# Patient Record
Sex: Male | Born: 1975 | Race: Black or African American | Hispanic: No | Marital: Single | Smoking: Current every day smoker
Health system: Southern US, Community
[De-identification: ages and names within clinical notes are randomized; demographics above are authoritative.]

---

## 2002-12-26 ENCOUNTER — Emergency Department (HOSPITAL_COMMUNITY): Admission: EM | Admit: 2002-12-26 | Discharge: 2002-12-26 | Payer: Self-pay | Admitting: Emergency Medicine

## 2006-08-18 ENCOUNTER — Emergency Department (HOSPITAL_COMMUNITY): Admission: EM | Admit: 2006-08-18 | Discharge: 2006-08-18 | Payer: Self-pay | Admitting: Emergency Medicine

## 2009-05-22 ENCOUNTER — Emergency Department (HOSPITAL_COMMUNITY): Admission: EM | Admit: 2009-05-22 | Discharge: 2009-05-22 | Payer: Self-pay | Admitting: Emergency Medicine

## 2011-03-06 ENCOUNTER — Emergency Department (HOSPITAL_COMMUNITY)
Admission: EM | Admit: 2011-03-06 | Discharge: 2011-03-06 | Disposition: A | Discharge status: Home / Self Care | Payer: Self-pay | Attending: Emergency Medicine | Admitting: Emergency Medicine

## 2011-03-06 DIAGNOSIS — M25569 Pain in unspecified knee: Secondary | ICD-10-CM | POA: Insufficient documentation

## 2011-03-06 DIAGNOSIS — M25469 Effusion, unspecified knee: Secondary | ICD-10-CM | POA: Insufficient documentation

## 2011-03-18 ENCOUNTER — Ambulatory Visit (HOSPITAL_BASED_OUTPATIENT_CLINIC_OR_DEPARTMENT_OTHER)
Admission: RE | Admit: 2011-03-18 | Discharge: 2011-03-18 | Disposition: A | Discharge status: Home / Self Care | Payer: Self-pay | Source: Ambulatory Visit | Attending: Orthopedic Surgery | Admitting: Orthopedic Surgery

## 2011-03-18 DIAGNOSIS — F17200 Nicotine dependence, unspecified, uncomplicated: Secondary | ICD-10-CM | POA: Insufficient documentation

## 2011-03-18 DIAGNOSIS — Z01812 Encounter for preprocedural laboratory examination: Secondary | ICD-10-CM | POA: Insufficient documentation

## 2011-03-18 DIAGNOSIS — M23329 Other meniscus derangements, posterior horn of medial meniscus, unspecified knee: Secondary | ICD-10-CM | POA: Insufficient documentation

## 2011-03-18 DIAGNOSIS — M2340 Loose body in knee, unspecified knee: Secondary | ICD-10-CM | POA: Insufficient documentation

## 2011-03-18 DIAGNOSIS — M2350 Chronic instability of knee, unspecified knee: Secondary | ICD-10-CM | POA: Insufficient documentation

## 2011-03-20 NOTE — Op Note (Signed)
NAME:  Scott Mckee, Scott Mckee NO.:  [id number]  MEDICAL RECORD NO.:  [id number]           PATIENT TYPE:  E  LOCATION:  [facility]                         FACILITY:  [facility]  PHYSICIAN:  Eulas Post, MD    DATE OF BIRTH:  Mar 06, 1976  DATE OF PROCEDURE:  03/18/2011 DATE OF DISCHARGE:  03/06/2011                              OPERATIVE REPORT   PREOPERATIVE DIAGNOSIS:  Left knee anterior cruciate ligament tear.  POSTOPERATIVE DIAGNOSIS:  Left knee anterior cruciate ligament tear, medial meniscus tear, loose body.  OPERATIVE PROCEDURE:  Left knee arthroscopy with ACL reconstruction using hamstring autograft and partial medial meniscectomy with removal of 2 loose bodies, one measured almost 2 cm in diameter.  PREOPERATIVE INDICATIONS:  Mr. Scott Mckee is a 35 year old gentleman who has had chronic knee instability with ACL disruption.  He elected to undergo reconstruction.  The risks, benefits, and alternatives were discussed before the procedure including, but not limited to risks of infection, bleeding, nerve injury, stiffness, incomplete relief of symptoms, recurrent instability, recurrent rupture, progression of arthritis, cardiopulmonary complications, among others, and he is willing to proceed.  ATTENDING SURGEON:  Eulas Post, MD  FIRST ASSISTANT:  Janace Litten, Orthopedic PA-C  OPERATIVE PROCEDURE:  The patient was brought to the operating room, placed in supine position.  IV antibiotics were given.  General anesthesia administered.  The left lower extremity was prepped and draped in the usual sterile fashion.  Regional block had also been given.  The leg was elevated, exsanguinated, and tourniquet was inflated.  Examination under anesthesia was done prior to prepping and draping, and this demonstrated ACL deficiency.  I carefully examined his lateral collateral and his posterolateral lateral ligament, and these did not appear to be loose.  He had some  varus deformity, I suspect secondary to progression of early arthritis.  I began with harvesting his hamstring tendons.  After the leg was elevated, exsanguinated, and the tourniquet was inflated, incision was made over the hamstring insertion and the sartorius fascia was reflected and the gracilis and semitendinosus were harvested without difficulty. Excellent grafts were achieved.  Care was taken to protect the saphenous nerve.  The grafts were then prepared.  I then turned my attention to the knee and diagnostic arthroscopy was carried out.  The undersurface of the patella as well as the patellofemoral joint were normal.  The articular surfaces of the medial compartment had diffuse grade 2 wear.  There was also tearing of the medial meniscus in the mid substance of the body as well as posteriorly around the posterior horn.  The anterior horn was fairly blunted.  The meniscus was debrided using arthroscopic shaver and the biter back to a stable rim.  The lateral compartment had relatively intact cartilage, although there was a fairly substantial loose body that was underneath the posterior horn of the lateral meniscus.  This was debrided down of the shaver and removed piecemeal with a rongeur.  I then removed a very large loose body in the anterior notch, which measured almost 2 cm in diameter.  I then debrided the old ACL which was completely nonfunctional.  The PCL was intact.  I performed a mild notchplasty.  I then placed my guide for the femoral tunnel using a 9 retro cutter. The graft size measured to a size 9.  Once I had drilled my tunnel, I confirmed that the back wall was indeed intact and I had excellent position of the tunnel that was low approximating the anatomic footprint of the ACL.  I then passed my FiberWire as the holding stitch and passed my retro cutter through the tibial tunnel and then opened the tibia with a 9 and then began with a 5 and dilated back up  to a 9.  Once the tunnels had been prepared, I then passed the graft.  I did have the graft on tension during the preparation of the tunnels.  The graft passed easily and the button flipped and I was able to visualize it directly by looking up through the tunnel and then I used C-arm to confirm appropriate position of the button.  I then delivered the graft all the way up and I had 25 mm of graft in the tunnel and I cycled the knee.  Appropriate isometry was confirmed, and any slack within the system was removed.  I then measured the length of my tibial tunnel, and measured 40 mm and so I selected a size 10 x 35- mm Arthrex bio-composite interference screw.  This was placed without difficulty and provided excellent fixation.  The tension of the graft was excellent and the knee was then stable.  A final C-arm picture was taken to confirm position of the button which was appropriate on the bone, and I also took final arthroscopic pictures.  The knee reached full extension easily without any graft impingement.  The knee was then irrigated copiously and all of the arthroscopic instruments removed and the sartorius fascia closed with 0 Vicryl followed by 3-0 for subcutaneous tissue and 4-0 Monocryl for the skin and portal sites. Steri-Strips and sterile gauze were applied.  The tourniquet was released and tourniquet time was 2 hours.  The patient was then awakened and extubated and returned to the PACU in stable satisfactory condition. There were no complications.  He tolerated the procedure well.  Janace Litten, Orthopedic PA-C was present and scrubbed throughout the procedure and critical for completion both with exposure as well as graft preparation and closure as well as assistance with passage and drilling tunnels.     Eulas Post, MD     JPL/MEDQ  D:  03/18/2011  T:  03/19/2011  Job:  [id number]  Electronically Signed by Teryl Lucy MD on 03/20/2011 11:28:35 AM

## 2011-03-31 NOTE — Op Note (Signed)
NAME:  Scott Mckee, Scott Mckee NO.:  [id number]  MEDICAL RECORD NO.:  [id number]           PATIENT TYPE:  LOCATION:                                 FACILITY:  PHYSICIAN:  Eulas Post, MD    DATE OF BIRTH:  [birth year]  DATE OF PROCEDURE:  03/18/2011 DATE OF DISCHARGE:                              OPERATIVE REPORT   ATTENDING SURGEON:  Eulas Post, MD  FIRST ASSISTANT:  Janace Litten, OPA  PREOPERATIVE DIAGNOSIS:  Left knee anterior cruciate ligament tear, loose bodies, question lateral collateral ligament deficiency.  POSTOPERATIVE DIAGNOSIS:  Left anterior cruciate ligament tear, chronic medial degenerative changes, tear of the medial meniscus in the mid substance of the body and posterior horn, and large loose body in the central portion of the notch as well as large loose body on the undersurface of the lateral meniscus.  No evidence for posterolateral corner or lateral collateral ligament tear.  OPERATIVE PROCEDURE:  Left knee arthroscopy with ACL reconstruction using hamstring autograft and removal of a 2-cm loose body from the notch as well as one from the posterolateral aspect of the tibia with partial medial meniscectomy.  OPERATIVE IMPLANTS:  Hamstring autograft with Arthrex RetroButton and the size 9 graft with a 10 x 35-mm composite tibial interference screw.  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  He had multiple recurrent episodes of instability and elected for ACL reconstruction and the above-named procedures.  The risks, benefits and alternatives were discussed before the procedure including, but not limited to the risks of infection, bleeding, nerve injury, recurrent instability, recurrent tear, progression of arthritis, incomplete relief of pain, stiffness, cardiopulmonary complications, among others and they were willing to  proceed.  OPERATIVE FINDINGS:  The medial compartment had at least grade 2 changes.  The lateral side was intact.  There was a fairly large loose body both underneath the lateral meniscus as well as in the center of the notch.  Both of these were removed.  The ACL was torn.  The PCL was intact.  Lateral collateral and posterolateral corner were intact and tested with Veress testing as well as dial test.  OPERATIVE PROCEDURE:  The patient was brought to the operating room and placed in supine position.  General anesthesia was administered.  Exam under anesthesia demonstrated instability with Lachman testing.  Lateral collateral was intact.  After general anesthesia was induced and exam under anesthesia completed, the left lower extremity was prepped and draped in the usual sterile fashion.  Diagnostic arthroscopy was carried out after I harvested his hamstring tendons.  The hamstring tendons harvest went quite smoothly, without any complication, and we had excellent graft quality.  The graft sized to size 9.  I used an arthroscopic shaver as well as an arthroscopic biter to debride the medial meniscus back to a stable rim. He had substantial degenerative changes on his medial compartment.  The ACL was completely torn.  The stump and tissue was debrided and removed and a small notchplasty was performed.  I had  to extend the size of my portal in order to get the huge loose body out of the anterior portion of his knee.  There was also an another smaller loose body that was in the posterior aspect of the tibia on the lateral side underneath the meniscus.  This was removed with an arthroscopic pituitary biter, as well as the shaver.  Once all of this had been completed, I used the size 9 retro cutter for the femur, and then a size 9 for the tibia.  I also opened the tibial cortex with a size 9 reamer and also dilated up.  I passed the graft and confirmed position of the button using C-arm.   Excellent fixation was achieved on the femoral side.  I cycled the knee and had appropriate isometry, then I tensioned the graft and placed a 10 x 35-mm biocomposite tibial interference screw.  Excellent fixation was achieved.  There was no graft impingement.  I then cut the sutures and cut the stitches off of the graft and removed the arthroscopic instruments.  Wounds were closed with Vicryl followed by Monocryl.  The patient was awakened and returned to the PACU in stable and satisfactory condition.  Sterile gauze was applied after closure.  He tolerated this well.  There were no complications.  Weightbearing as tolerated, and we will work on range of motion and quadriceps strengthening postoperatively.  Janace Litten, orthopedic PA-C was present and scrubbed throughout the case and critical for completion with assistance with exposure, retraction, preparation of the graft, as well as positioning of equipment during tunnel placement and passage as well as closure.     Eulas Post, MD     JPL/MEDQ  D:  03/28/2011  T:  03/29/2011  Job:  [id number]  Electronically Signed by Teryl Lucy MD on 03/31/2011 10:18:07 AM

## 2014-02-20 ENCOUNTER — Emergency Department (HOSPITAL_COMMUNITY)
Admission: EM | Admit: 2014-02-20 | Discharge: 2014-02-20 | Disposition: A | Discharge status: Home / Self Care | Payer: Self-pay | Attending: Emergency Medicine | Admitting: Emergency Medicine

## 2014-02-20 ENCOUNTER — Encounter (HOSPITAL_COMMUNITY): Payer: Self-pay | Admitting: Emergency Medicine

## 2014-02-20 DIAGNOSIS — M2550 Pain in unspecified joint: Secondary | ICD-10-CM

## 2014-02-20 DIAGNOSIS — M62838 Other muscle spasm: Secondary | ICD-10-CM | POA: Insufficient documentation

## 2014-02-20 DIAGNOSIS — F17200 Nicotine dependence, unspecified, uncomplicated: Secondary | ICD-10-CM | POA: Insufficient documentation

## 2014-02-20 DIAGNOSIS — M2559 Pain in other specified joint: Secondary | ICD-10-CM | POA: Insufficient documentation

## 2014-02-20 MED ORDER — CYCLOBENZAPRINE HCL 10 MG PO TABS: 10.0000 mg | ORAL_TABLET | Freq: Two times a day (BID) | ORAL | Status: DC | PRN

## 2014-02-20 NOTE — ED Notes (Signed)
Pt c/o right neck and shoulder pain x 1 month. No known injury. States has intermittent tingling in fingers of right hand.

## 2014-02-20 NOTE — Discharge Instructions (Signed)
No driving or operating heavy machinery while taking flexeril. This medication may make you drowsy.  Spasticity Spasticity is a condition in which certain muscles contract continuously. This causes stiffness or tightness of the muscles. It may interfere with movement, speech, and manner of walking. CAUSES  This condition is usually caused by damage to the portion of the brain or spinal cord that controls voluntary movement. It may occur in association with:  Spinal cord injury.  Multiple sclerosis.  Cerebral palsy.  Brain damage due to lack of oxygen.  Brain trauma.  Severe head injury.  Metabolic diseases such as:  Adrenoleukodystrophy.  ALS Truddie Hidden(Lou Gehrig's disease).  Phenylketonuria. SYMPTOMS   Increased muscle tone (hypertonicity).  A series of rapid muscle contractions (clonus).  Exaggerated deep tendon reflexes.  Muscle spasms.  Involuntary crossing of the legs (scissoring).  Fixed joints. The degree of spasticity varies. It ranges from mild muscle stiffness to severe, painful, and uncontrollable muscle spasms. It can interfere with rehabilitation in patients with certain disorders. It often interferes with daily activities. TREATMENT  Treatment may include:  Medications.  Physical therapy regimens. They may include muscle stretching and range of motion exercises. These help prevent shrinkage or shortening of muscles. They also help reduce the severity of symptoms.  Surgery. This may be recommended for tendon release or to sever the nerve-muscle pathway. PROGNOSIS  The outcome for those with spasticity depends on:  Severity of the spasticity.  Associated disorder(s). Document Released: 11/21/2002 Document Revised: 02/23/2012 Document Reviewed: 12/01/2005 [facility] Patient Information 2014 [place], [place].

## 2014-02-20 NOTE — ED Provider Notes (Signed)
CSN: [id number]     Arrival date & time 02/20/14  1442 History  This chart was scribed for non-physician practitioner, Johnnette Gourdobyn Albert, PA-C working with Gavin PoundMichael Y. Oletta LamasGhim, MD by Greggory StallionKayla Andersen, ED scribe. This patient was seen in room TR05C/TR05C and the patient's care was started at 3:14 PM.   Chief Complaint  Patient presents with  . Shoulder Pain   The history is provided by the patient. No language interpreter was used.   HPI Comments: Scott Mckee is a 38 y.o. male who presents to the Emergency Department complaining of gradual onset, constant right sided neck pain and right shoulder pain that started one month ago. Denies known injury or heavy lifting. Pt was recently wrestling with his son and states that made the pain worse. He has had intermittent tingling in his right fingers. Pt has tried pain patches, creams, warm compressions, cold compressions, tylenol and ibuprofen with no relief.   History reviewed. No pertinent past medical history. History reviewed. No pertinent past surgical history. No family history on file. History  Substance Use Topics  . Smoking status: Current Every Day Smoker  . Smokeless tobacco: Not on file  . Alcohol Use: Yes    Review of Systems  Musculoskeletal: Positive for arthralgias and neck pain.  All other systems reviewed and are negative.   Allergies  Review of patient's allergies indicates no known allergies.  Home Medications   Current Outpatient Rx  Name  Route  Sig  Dispense  Refill  . cyclobenzaprine (FLEXERIL) 10 MG tablet   Oral   Take 1 tablet (10 mg total) by mouth 2 (two) times daily as needed for muscle spasms.   20 tablet   0     BP 123/85  Pulse 75  Temp(Src) 98 F (36.7 C) (Oral)  Ht 5\' 5"  (1.651 m)  Wt 148 lb (67.132 kg)  BMI 24.63 kg/m2  SpO2 100%  Physical Exam  Nursing note and vitals reviewed. Constitutional: He is oriented to person, place, and time. He appears well-developed and well-nourished. No distress.   HENT:  Head: Normocephalic and atraumatic.  Mouth/Throat: Oropharynx is clear and moist.  Eyes: Conjunctivae and EOM are normal.  Neck: Normal range of motion. Neck supple. No spinous process tenderness and no muscular tenderness present.  Cardiovascular: Normal rate, regular rhythm, normal heart sounds and intact distal pulses.   Pulmonary/Chest: Effort normal and breath sounds normal. No respiratory distress.  Musculoskeletal: Normal range of motion. He exhibits no edema.  Tender to palpation of right trapezius and rhomboid muscles with spasm. Full ROM of right shoulder. Full ROM of C-spine. No spinous process tenderness.   Neurological: He is alert and oriented to person, place, and time. He has normal strength.  Strength upper extremities 5/5 and equal bilateral. Sensation intact.  Skin: Skin is warm and dry. No rash noted. He is not diaphoretic.  Psychiatric: He has a normal mood and affect. His behavior is normal.    ED Course  Procedures (including critical care time)  DIAGNOSTIC STUDIES: Oxygen Saturation is 100% on RA, normal by my interpretation.    COORDINATION OF CARE: 3:17 PM-Discussed treatment plan which includes a muscle relaxer with pt at bedside and pt agreed to plan.   Labs Review Labs Reviewed - No data to display Imaging Review No results found.   EKG Interpretation None      MDM   Final diagnoses:  Neck muscle spasm   Patient was in with right-sided neck and shoulder pain.  He is well appearing and in no apparent distress, afebrile with normal vital signs. No meningeal signs. No spinous process tenderness. No focal neurologic deficits. She muscle spasm with Flexeril, advised heat. Continue NSAIDs. Stable for discharge. Return precautions given. Patient states understanding of treatment care plan and is agreeable.   I personally performed the services described in this documentation, which was scribed in my presence. The recorded information has been  reviewed and is accurate.   Trevor Mace, PA-C 02/20/14 1530

## 2014-02-22 NOTE — ED Provider Notes (Signed)
Medical screening examination/treatment/procedure(s) were performed by non-physician practitioner and as supervising physician I was immediately available for consultation/collaboration.   EKG Interpretation None        Gavin PoundMichael Y. Oletta LamasGhim, MD 02/22/14 2034

## 2017-10-24 ENCOUNTER — Other Ambulatory Visit: Payer: Self-pay | Admitting: Nurse Practitioner

## 2020-08-16 ENCOUNTER — Ambulatory Visit (HOSPITAL_COMMUNITY)
Admission: EM | Admit: 2020-08-16 | Discharge: 2020-08-16 | Disposition: A | Discharge status: Home / Self Care | Payer: Self-pay | Attending: Urgent Care | Admitting: Urgent Care

## 2020-08-16 ENCOUNTER — Encounter (HOSPITAL_COMMUNITY): Payer: Self-pay | Admitting: *Deleted

## 2020-08-16 ENCOUNTER — Other Ambulatory Visit: Payer: Self-pay

## 2020-08-16 DIAGNOSIS — R1084 Generalized abdominal pain: Secondary | ICD-10-CM

## 2020-08-16 DIAGNOSIS — K5900 Constipation, unspecified: Secondary | ICD-10-CM

## 2020-08-16 MED ORDER — DOCUSATE SODIUM 100 MG PO CAPS: 100.0000 mg | ORAL_CAPSULE | Freq: Two times a day (BID) | ORAL | 0 refills | Status: DC

## 2020-08-16 MED ORDER — FAMOTIDINE 20 MG PO TABS: 20.0000 mg | ORAL_TABLET | Freq: Two times a day (BID) | ORAL | 0 refills | Status: DC

## 2020-08-16 NOTE — ED Triage Notes (Signed)
Patient reports intermittent right upper quadrant pain x 2-3 days. States pain has been progressively worsening. Reports nausea and vomiting (last episode last night).   Denies anything to endorse pain.   States is working 2 jobs and is stressed.   Drinks ETOH a couple times a week, but not every day.

## 2020-08-16 NOTE — Discharge Instructions (Signed)

## 2020-08-16 NOTE — ED Provider Notes (Signed)
[facility]   MRN: [id number] DOB: 1976-12-06  Subjective:   Scott Mckee is a 44 y.o. male presenting for 2 to 3-day history of acute onset persistent upper abdominal pain.  Patient states that he has been nauseous, had vomiting last night.  Denies active fever, chest pain, shortness of breath, diarrhea.  He states that he does have hard stools, strains to defecate, has a bowel movement every other day.  Drinks 2-3 beers a couple times a week but not daily.  Patient works at [employer] and eats heavy amount of carbohydrates and a lot of meat.  Denies eating much fiber.  Denies bloody stools, inability to pass gas, history of belly surgeries.  Patient is otherwise healthy and has no chronic conditions.  Denies taking any chronic medications.  No Known Allergies  History reviewed. No pertinent past medical history.   History reviewed. No pertinent surgical history.  History reviewed. No pertinent family history.  Social History   Tobacco Use  . Smoking status: Current Every Day Smoker  . Smokeless tobacco: Never Used  Substance Use Topics  . Alcohol use: Yes  . Drug use: No    ROS   Objective:   Vitals: BP (!) 148/78 (BP Location: Left Arm)   Pulse 73   Temp 98.2 F (36.8 C) (Oral)   Resp 16   SpO2 98%   BP Readings from Last 3 Encounters:  08/16/20 (!) 148/78  02/20/14 123/85   Physical Exam Constitutional:      General: He is not in acute distress.    Appearance: Normal appearance. He is well-developed. He is not ill-appearing, toxic-appearing or diaphoretic.  HENT:     Head: Normocephalic and atraumatic.     Right Ear: External ear normal.     Left Ear: External ear normal.     Nose: Nose normal.     Mouth/Throat:     Mouth: Mucous membranes are moist.     Pharynx: Oropharynx is clear.  Eyes:     General: No scleral icterus.    Extraocular Movements: Extraocular movements intact.     Pupils: Pupils are equal, round, and reactive  to light.  Cardiovascular:     Rate and Rhythm: Normal rate and regular rhythm.     Heart sounds: Normal heart sounds. No murmur heard.  No friction rub. No gallop.   Pulmonary:     Effort: Pulmonary effort is normal. No respiratory distress.     Breath sounds: Normal breath sounds. No stridor. No wheezing, rhonchi or rales.  Abdominal:     General: Abdomen is flat. Bowel sounds are normal. There is no distension.     Palpations: Abdomen is soft.     Tenderness: There is generalized abdominal tenderness and tenderness in the right upper quadrant, epigastric area and left upper quadrant. There is no right CVA tenderness, left CVA tenderness, guarding or rebound.  Skin:    General: Skin is warm and dry.  Neurological:     Mental Status: He is alert and oriented to person, place, and time.  Psychiatric:        Mood and Affect: Mood normal.        Behavior: Behavior normal.        Thought Content: Thought content normal.       Assessment and Plan :   PDMP not reviewed this encounter.  1. Generalized abdominal pain   2. Constipation, unspecified constipation type     Suspect peptic ulcer disease,  acid reflux, constipation, gastritis secondary to lifestyle of poor diet, regular consumption of carbohydrates and restaurant food.  Recommended lifestyle modifications, start obesity, Pepcid.  No signs of an acute abdomen.  Low suspicion for liver disease, hepatitis, gallbladder disease but discussed this is a possibility and he has no improvement, will follow up in do further work-up for this.  Counseled patient on potential for adverse effects with medications prescribed/recommended today, ER and return-to-clinic precautions discussed, patient verbalized understanding.    Wallis Bamberg, PA-C 08/16/20 1850

## 2021-07-12 ENCOUNTER — Other Ambulatory Visit: Payer: Self-pay

## 2021-07-12 ENCOUNTER — Observation Stay (HOSPITAL_COMMUNITY)
Admission: EM | Admit: 2021-07-12 | Discharge: 2021-07-14 | Disposition: A | Discharge status: Home / Self Care | Payer: Self-pay | Attending: Internal Medicine | Admitting: Internal Medicine

## 2021-07-12 ENCOUNTER — Encounter (HOSPITAL_COMMUNITY): Payer: Self-pay

## 2021-07-12 DIAGNOSIS — E871 Hypo-osmolality and hyponatremia: Secondary | ICD-10-CM | POA: Insufficient documentation

## 2021-07-12 DIAGNOSIS — I10 Essential (primary) hypertension: Secondary | ICD-10-CM | POA: Insufficient documentation

## 2021-07-12 DIAGNOSIS — Z20822 Contact with and (suspected) exposure to covid-19: Secondary | ICD-10-CM | POA: Insufficient documentation

## 2021-07-12 DIAGNOSIS — F17290 Nicotine dependence, other tobacco product, uncomplicated: Secondary | ICD-10-CM | POA: Insufficient documentation

## 2021-07-12 DIAGNOSIS — K859 Acute pancreatitis without necrosis or infection, unspecified: Principal | ICD-10-CM | POA: Insufficient documentation

## 2021-07-12 DIAGNOSIS — K8020 Calculus of gallbladder without cholecystitis without obstruction: Secondary | ICD-10-CM | POA: Insufficient documentation

## 2021-07-12 DIAGNOSIS — Z79899 Other long term (current) drug therapy: Secondary | ICD-10-CM | POA: Insufficient documentation

## 2021-07-12 LAB — URINALYSIS, ROUTINE W REFLEX MICROSCOPIC
Bilirubin Urine: NEGATIVE
Glucose, UA: NEGATIVE mg/dL
Ketones, ur: 20 mg/dL — AB
Leukocytes,Ua: NEGATIVE
Nitrite: NEGATIVE
Protein, ur: 100 mg/dL — AB
Specific Gravity, Urine: 1.016 (ref 1.005–1.030)
pH: 5 (ref 5.0–8.0)

## 2021-07-12 LAB — CBC WITH DIFFERENTIAL/PLATELET
Abs Immature Granulocytes: 0.03 10*3/uL (ref 0.00–0.07)
Basophils Absolute: 0 10*3/uL (ref 0.0–0.1)
Basophils Relative: 0 %
Eosinophils Absolute: 0.1 10*3/uL (ref 0.0–0.5)
Eosinophils Relative: 1 %
HCT: 40.9 % (ref 39.0–52.0)
Hemoglobin: 14.5 g/dL (ref 13.0–17.0)
Immature Granulocytes: 0 %
Lymphocytes Relative: 20 %
Lymphs Abs: 1.8 10*3/uL (ref 0.7–4.0)
MCH: 33 pg (ref 26.0–34.0)
MCHC: 35.5 g/dL (ref 30.0–36.0)
MCV: 93.2 fL (ref 80.0–100.0)
Monocytes Absolute: 0.8 10*3/uL (ref 0.1–1.0)
Monocytes Relative: 9 %
Neutro Abs: 6.2 10*3/uL (ref 1.7–7.7)
Neutrophils Relative %: 70 %
Platelets: 248 10*3/uL (ref 150–400)
RBC: 4.39 MIL/uL (ref 4.22–5.81)
RDW: 13.6 % (ref 11.5–15.5)
WBC: 8.8 10*3/uL (ref 4.0–10.5)
nRBC: 0 % (ref 0.0–0.2)

## 2021-07-12 LAB — COMPREHENSIVE METABOLIC PANEL
ALT: 34 U/L (ref 0–44)
AST: 44 U/L — ABNORMAL HIGH (ref 15–41)
Albumin: 4.2 g/dL (ref 3.5–5.0)
Alkaline Phosphatase: 125 U/L (ref 38–126)
Anion gap: 12 (ref 5–15)
BUN: <5 mg/dL — ABNORMAL LOW (ref 6–20)
CO2: 24 mmol/L (ref 22–32)
Calcium: 9.7 mg/dL (ref 8.9–10.3)
Chloride: 91 mmol/L — ABNORMAL LOW (ref 98–111)
Creatinine, Ser: 0.86 mg/dL (ref 0.61–1.24)
GFR, Estimated: >60 mL/min (ref >60)
Glucose, Bld: 161 mg/dL — ABNORMAL HIGH (ref 70–99)
Potassium: 3.6 mmol/L (ref 3.5–5.1)
Sodium: 127 mmol/L — ABNORMAL LOW (ref 135–145)
Total Bilirubin: 0.9 mg/dL (ref 0.3–1.2)
Total Protein: 7.5 g/dL (ref 6.5–8.1)

## 2021-07-12 LAB — LIPASE, BLOOD: Lipase: 584 U/L — ABNORMAL HIGH (ref 11–51)

## 2021-07-12 LAB — TROPONIN I (HIGH SENSITIVITY)
Troponin I (High Sensitivity): 9 ng/L (ref <18)
Troponin I (High Sensitivity): 9 ng/L (ref <18)

## 2021-07-12 MED ORDER — OXYCODONE-ACETAMINOPHEN 5-325 MG PO TABS
1.0000 | ORAL_TABLET | Freq: Once | ORAL | Status: AC
  Administered 2021-07-12: 1 via ORAL
  Filled 2021-07-12: qty 1

## 2021-07-12 MED ORDER — ONDANSETRON 4 MG PO TBDP
4.0000 mg | ORAL_TABLET | Freq: Once | ORAL | Status: AC
  Administered 2021-07-12: 4 mg via ORAL
  Filled 2021-07-12: qty 1

## 2021-07-12 NOTE — ED Triage Notes (Signed)
Pt reports chest pain and abd pain for the past 2 days, emesis x2. Pt also reports some constipation.

## 2021-07-12 NOTE — ED Provider Notes (Signed)
Emergency Medicine Provider Triage Evaluation Note  Scott Mckee , a 45 y.o. male  was evaluated in triage.  Pt complains of epigastric abd pain and chest pain that started a few days ago. He further reports nv.   Review of Systems  Positive: Abd pain, chest pain, nv Negative: diarrhea  Physical Exam  BP (!) 175/109   Pulse 60   Temp 98.6 F (37 C) (Oral)   Resp 20   Ht 5\' 5"  (1.651 m)   Wt 59 kg   SpO2 99%   BMI 21.63 kg/m  Gen:   Awake, no distress   Resp:  Normal effort  MSK:   Moves extremities without difficulty  Other:  Epigastric and luq ttp  Medical Decision Making  Medically screening exam initiated at 6:05 PM.  Appropriate orders placed.  was informed that the remainder of the evaluation will be completed by another provider, this initial triage assessment does not replace that evaluation, and the importance of remaining in the ED until their evaluation is complete.     Estill Bamberg 07/12/21 1806    07/14/21, MD 07/12/21 2231

## 2021-07-13 ENCOUNTER — Emergency Department (HOSPITAL_COMMUNITY): Payer: Self-pay

## 2021-07-13 DIAGNOSIS — K859 Acute pancreatitis without necrosis or infection, unspecified: Secondary | ICD-10-CM | POA: Diagnosis present

## 2021-07-13 DIAGNOSIS — E871 Hypo-osmolality and hyponatremia: Secondary | ICD-10-CM

## 2021-07-13 DIAGNOSIS — I10 Essential (primary) hypertension: Secondary | ICD-10-CM

## 2021-07-13 DIAGNOSIS — Z720 Tobacco use: Secondary | ICD-10-CM

## 2021-07-13 DIAGNOSIS — Z7289 Other problems related to lifestyle: Secondary | ICD-10-CM

## 2021-07-13 LAB — CBC WITH DIFFERENTIAL/PLATELET
Abs Immature Granulocytes: 0.02 10*3/uL (ref 0.00–0.07)
Basophils Absolute: 0 10*3/uL (ref 0.0–0.1)
Basophils Relative: 0 %
Eosinophils Absolute: 0.1 10*3/uL (ref 0.0–0.5)
Eosinophils Relative: 1 %
HCT: 41.8 % (ref 39.0–52.0)
Hemoglobin: 14.8 g/dL (ref 13.0–17.0)
Immature Granulocytes: 0 %
Lymphocytes Relative: 20 %
Lymphs Abs: 1.6 10*3/uL (ref 0.7–4.0)
MCH: 32.9 pg (ref 26.0–34.0)
MCHC: 35.4 g/dL (ref 30.0–36.0)
MCV: 92.9 fL (ref 80.0–100.0)
Monocytes Absolute: 0.8 10*3/uL (ref 0.1–1.0)
Monocytes Relative: 10 %
Neutro Abs: 5.5 10*3/uL (ref 1.7–7.7)
Neutrophils Relative %: 69 %
Platelets: 228 10*3/uL (ref 150–400)
RBC: 4.5 MIL/uL (ref 4.22–5.81)
RDW: 13.3 % (ref 11.5–15.5)
WBC: 8.1 10*3/uL (ref 4.0–10.5)
nRBC: 0 % (ref 0.0–0.2)

## 2021-07-13 LAB — COMPREHENSIVE METABOLIC PANEL
ALT: 31 U/L (ref 0–44)
AST: 41 U/L (ref 15–41)
Albumin: 4.1 g/dL (ref 3.5–5.0)
Alkaline Phosphatase: 124 U/L (ref 38–126)
Anion gap: 12 (ref 5–15)
BUN: <5 mg/dL — ABNORMAL LOW (ref 6–20)
CO2: 23 mmol/L (ref 22–32)
Calcium: 9.6 mg/dL (ref 8.9–10.3)
Chloride: 92 mmol/L — ABNORMAL LOW (ref 98–111)
Creatinine, Ser: 0.82 mg/dL (ref 0.61–1.24)
GFR, Estimated: >60 mL/min (ref >60)
Glucose, Bld: 113 mg/dL — ABNORMAL HIGH (ref 70–99)
Potassium: 3.7 mmol/L (ref 3.5–5.1)
Sodium: 127 mmol/L — ABNORMAL LOW (ref 135–145)
Total Bilirubin: 1 mg/dL (ref 0.3–1.2)
Total Protein: 7.5 g/dL (ref 6.5–8.1)

## 2021-07-13 LAB — RESP PANEL BY RT-PCR (FLU A&B, COVID) ARPGX2
Influenza A by PCR: NEGATIVE
Influenza B by PCR: NEGATIVE
SARS Coronavirus 2 by RT PCR: NEGATIVE

## 2021-07-13 LAB — MAGNESIUM: Magnesium: 1.9 mg/dL (ref 1.7–2.4)

## 2021-07-13 LAB — HIV ANTIBODY (ROUTINE TESTING W REFLEX): HIV Screen 4th Generation wRfx: NONREACTIVE

## 2021-07-13 LAB — PHOSPHORUS: Phosphorus: 3.7 mg/dL (ref 2.5–4.6)

## 2021-07-13 LAB — LIPASE, BLOOD: Lipase: 408 U/L — ABNORMAL HIGH (ref 11–51)

## 2021-07-13 MED ORDER — ADULT MULTIVITAMIN W/MINERALS CH
1.0000 | ORAL_TABLET | Freq: Every day | ORAL | Status: DC
  Administered 2021-07-13 – 2021-07-14 (×2): 1 via ORAL
  Filled 2021-07-13 (×2): qty 1

## 2021-07-13 MED ORDER — ACETAMINOPHEN 325 MG PO TABS: 650.0000 mg | ORAL_TABLET | Freq: Four times a day (QID) | ORAL | Status: DC | PRN

## 2021-07-13 MED ORDER — SENNOSIDES-DOCUSATE SODIUM 8.6-50 MG PO TABS
1.0000 | ORAL_TABLET | Freq: Every day | ORAL | Status: DC
  Administered 2021-07-13: 1 via ORAL
  Filled 2021-07-13: qty 1

## 2021-07-13 MED ORDER — MORPHINE SULFATE (PF) 4 MG/ML IV SOLN
4.0000 mg | Freq: Once | INTRAVENOUS | Status: AC
  Administered 2021-07-13: 4 mg via INTRAVENOUS
  Filled 2021-07-13: qty 1

## 2021-07-13 MED ORDER — THIAMINE HCL 100 MG PO TABS
100.0000 mg | ORAL_TABLET | Freq: Every day | ORAL | Status: DC
  Administered 2021-07-13 – 2021-07-14 (×2): 100 mg via ORAL
  Filled 2021-07-13 (×2): qty 1

## 2021-07-13 MED ORDER — ACETAMINOPHEN 650 MG RE SUPP: 650.0000 mg | Freq: Four times a day (QID) | RECTAL | Status: DC | PRN

## 2021-07-13 MED ORDER — ONDANSETRON HCL 4 MG PO TABS: 4.0000 mg | ORAL_TABLET | Freq: Four times a day (QID) | ORAL | Status: DC | PRN

## 2021-07-13 MED ORDER — SODIUM CHLORIDE 0.9% FLUSH
3.0000 mL | Freq: Two times a day (BID) | INTRAVENOUS | Status: DC
  Administered 2021-07-13 – 2021-07-14 (×3): 3 mL via INTRAVENOUS

## 2021-07-13 MED ORDER — ONDANSETRON 4 MG PO TBDP
4.0000 mg | ORAL_TABLET | Freq: Once | ORAL | Status: AC
  Administered 2021-07-13: 4 mg via ORAL
  Filled 2021-07-13: qty 1

## 2021-07-13 MED ORDER — IOHEXOL 300 MG/ML  SOLN
100.0000 mL | Freq: Once | INTRAMUSCULAR | Status: AC | PRN
  Administered 2021-07-13: 100 mL via INTRAVENOUS

## 2021-07-13 MED ORDER — LACTATED RINGERS IV BOLUS
1000.0000 mL | Freq: Once | INTRAVENOUS | Status: AC
  Administered 2021-07-13: 1000 mL via INTRAVENOUS

## 2021-07-13 MED ORDER — NICOTINE 14 MG/24HR TD PT24: 14.0000 mg | MEDICATED_PATCH | Freq: Every day | TRANSDERMAL | Status: DC | PRN

## 2021-07-13 MED ORDER — FOLIC ACID 1 MG PO TABS
1.0000 mg | ORAL_TABLET | Freq: Every day | ORAL | Status: DC
  Administered 2021-07-13 – 2021-07-14 (×2): 1 mg via ORAL
  Filled 2021-07-13 (×2): qty 1

## 2021-07-13 MED ORDER — LACTATED RINGERS IV SOLN: INTRAVENOUS | Status: AC

## 2021-07-13 MED ORDER — ONDANSETRON HCL 4 MG/2ML IJ SOLN: 4.0000 mg | Freq: Four times a day (QID) | INTRAMUSCULAR | Status: DC | PRN

## 2021-07-13 MED ORDER — THIAMINE HCL 100 MG/ML IJ SOLN: 100.0000 mg | Freq: Every day | INTRAMUSCULAR | Status: DC

## 2021-07-13 MED ORDER — OXYCODONE HCL 5 MG PO TABS
2.5000 mg | ORAL_TABLET | ORAL | Status: DC | PRN
Start: 2021-07-13 — End: 2021-07-14

## 2021-07-13 MED ORDER — ENOXAPARIN SODIUM 40 MG/0.4ML IJ SOSY
40.0000 mg | PREFILLED_SYRINGE | INTRAMUSCULAR | Status: DC
  Administered 2021-07-13: 40 mg via SUBCUTANEOUS
  Filled 2021-07-13: qty 0.4

## 2021-07-13 NOTE — ED Notes (Signed)
Patient transported to CT 

## 2021-07-13 NOTE — Progress Notes (Signed)
   07/13/21 2018  Notify: Provider  Provider Name/Title [facility]  Date Provider Notified 07/13/21  Time Provider Notified 2018  Notification Type Page  Notification Reason Other (Comment) (/need to be NPO for [facility] for [place] Abd on 07/14/2021)  Provider response Other (Comment) (waiting for response)   Patient made aware of pending procedure. Verbalized understanding to be NPO at [facility].

## 2021-07-13 NOTE — H&P (Addendum)
Date: 07/13/2021               Patient Name:  Scott Mckee MRN: [id number]  DOB: Dec 05, 1976 Age / Sex: 45 y.o., male   PCP: Patient, No Pcp Per (Inactive)         Medical Service: Internal Medicine Teaching Service         Attending Physician: Dr. Inez Catalina, MD    First Contact: Dr. Burnice Logan Pager: [phone]  Second Contact: Dr. Marchia Bond Pager: [phone]       After Hours (After 5p/  First Contact Pager: [phone]  weekends / holidays): Second Contact Pager: [phone]   Chief Complaint: chest and abdominal pain with NV  History of Present Illness: Scott Mckee is a 45 year old male with a PMHx of chronic pancreatitis, and ?GERD who presents to the ED with complaints of 3 days of abdominal pain that radiates to his back, with associated nausea and vomiting. First two days, pain was solely in abd, on day three, pain began to migrate towards his chest, described as sharp and exacerbated when he would lie flat. Pain when lying flat interfered with his ability to sleep. Tried ibuprofen with little to no relief. He had previously been evaluated in the urgent care 6 months ago for similar issues and was given pepcid for suspected acid reflux and peptic ulcer disease. He has been having associated nausea and vomiting. Two episodes of vomiting over past two days. Says he vomited up Gatorade, which he had just drank. Has not had a bowel movement in past two days either. No other complaints at this time. Reports last alcoholic drink was two days ago. He says that he has gotten shaky when withdrawing before.   Meds: OTC ibuprofen No outpatient medications have been marked as taking for the 07/12/21 encounter [facility] Encounter).     Allergies: Allergies as of 07/12/2021   (No Known Allergies)   History reviewed. No pertinent past medical history.  Family History: none  Social History: 1/2 ppd smoking, 3 beers three times per week. Works at two [employer]   Review of Systems: A  complete ROS was negative except as per HPI.   Physical Exam: Blood pressure (!) 159/129, pulse 89, temperature 98.3 F (36.8 C), temperature source Oral, resp. rate 17, height 5\' 5"  (1.651 m), weight 59 kg, SpO2 99 %. Physical Exam Constitutional:      Appearance: He is well-developed and normal weight.  HENT:     Head: Normocephalic and atraumatic.  Cardiovascular:     Rate and Rhythm: Normal rate and regular rhythm.     Heart sounds: Normal heart sounds.  Pulmonary:     Breath sounds: Normal breath sounds.  Abdominal:     Palpations: Abdomen is soft.     Comments: Diminished bowel sounds, tenderness to palpation in epigastric region  Musculoskeletal:        General: Normal range of motion.  Skin:    General: Skin is warm and dry.  Neurological:     General: No focal deficit present.     Mental Status: He is alert and oriented to person, place, and time.     EKG: personally reviewed my interpretation is sinus rhythm  CXR: personally reviewed my interpretation is none  Assessment & Plan by Problem: Active Problems:   Acute pancreatitis  # Acute on chronic pancreatitis - 3 days abd pain radiates to back, CT scan showing acute on chronic pancreatitis with 6 mm  psuedocyst within uncinate process. Elevated lipase 500s - Appears to be a chronic issue for him. He was evaluated 6 months ago in urgent care for similar issue. Suspect this may be secondary to underlying alcohol use.  - patient has not had bowel movement in 2 days. Will give senokot  - pain management oxycodone 2.5 and tylenol - IVFs LR 62ml per hour - Advance diet as tolerated - zofran for nausea  HTN - 175/109, suspect secondary to alcohol use  # hyponatremia - 127 on admission - will continue to monitor.  # alcohol use - drinks 3 beers three times per week. Last drink 2 days ago - history of shakes  - CIWA  Tobacco use - can give nicotine patch  Cannabis use   Dispo: Admit patient to  Observation with expected length of stay less than 2 midnights.  Signed: [name], MD 07/13/2021, 12:07 PM  Pager: [phone] After 5pm on weekdays and 1pm on weekends: On Call pager: [phone]

## 2021-07-13 NOTE — Progress Notes (Signed)
Patient received to the unit. Patient is alert and oriented x4. Iv in place. Skin assessment done with another nurse. Given instructions about call bell and phone. Bed in low position and call bell in reach. 

## 2021-07-13 NOTE — ED Notes (Signed)
Heat packs given for comfort.

## 2021-07-13 NOTE — ED Provider Notes (Addendum)
[facility] EMERGENCY DEPARTMENT Provider Note   CSN: [id number] Arrival date & time: 07/12/21  1749     History Chief Complaint  Patient presents with   Chest Pain   Abdominal Pain    Scott Mckee is a 45 y.o. male who presents with abdominal pain and chest pain for the past 3 days.  Describes it as a sharp constant pain and states it is mainly in his upper stomach and moves to his back.  Also endorses a pressure in his chest, non radiating.  States he has been nauseous for the past 3 days, and has not been able to keep any food down.  He states he had a pain similar before he went to urgent care for it but they said it was likely an ulcer.  Denies any fevers, chills.  States he has been smoking a pack a day for about 13 years, drinks about 3 beers 3 times a week, and endorses marijuana use about 1 blunt per day.  Denies any pertinent family history. Denies taking any medication. Does not follow with a PCP.   History reviewed. No pertinent past medical history.  There are no problems to display for this patient.   History reviewed. No pertinent surgical history.     No family history on file.  Social History   Tobacco Use   Smoking status: Every Day   Smokeless tobacco: Never  Substance Use Topics   Alcohol use: Yes   Drug use: No    Home Medications Prior to Admission medications   Medication Sig Start Date End Date Taking? Authorizing Provider  cyclobenzaprine (FLEXERIL) 10 MG tablet Take 1 tablet (10 mg total) by mouth 2 (two) times daily as needed for muscle spasms. 02/20/14   Hess, Nada Boozer, PA-C  docusate sodium (COLACE) 100 MG capsule Take 1 capsule (100 mg total) by mouth every 12 (twelve) hours. 08/16/20   Wallis Bamberg, PA-C  famotidine (PEPCID) 20 MG tablet Take 1 tablet (20 mg total) by mouth 2 (two) times daily. 08/16/20   Wallis Bamberg, PA-C    Allergies    Patient has no known allergies.  Review of Systems   Review of Systems   Constitutional:  Positive for fever. Negative for chills.  Gastrointestinal:  Positive for abdominal pain, constipation and vomiting. Negative for blood in stool and diarrhea.   Physical Exam Updated Vital Signs BP (!) 160/90   Pulse 87   Temp 98.5 F (36.9 C)   Resp 15   Ht 5\' 5"  (1.651 m)   Wt 59 kg   SpO2 100%   BMI 21.63 kg/m   Physical Exam Constitutional:      Appearance: He is ill-appearing.     Comments: In apparent pain   HENT:     Head: Normocephalic and atraumatic.  Cardiovascular:     Rate and Rhythm: Normal rate and regular rhythm.     Heart sounds: Normal heart sounds.  Pulmonary:     Effort: Pulmonary effort is normal.     Breath sounds: Normal breath sounds.  Abdominal:     Tenderness: There is abdominal tenderness.     Comments: Diffusely tender to light palpation  Musculoskeletal:        General: Normal range of motion.  Skin:    General: Skin is warm and dry.  Neurological:     Mental Status: He is alert.    ED Results / Procedures / Treatments   Labs (all labs ordered  are listed, but only abnormal results are displayed) Labs Reviewed  COMPREHENSIVE METABOLIC PANEL - Abnormal; Notable for the following components:      Result Value   Sodium 127 (*)    Chloride 91 (*)    Glucose, Bld 161 (*)    BUN <5 (*)    AST 44 (*)    All other components within normal limits  LIPASE, BLOOD - Abnormal; Notable for the following components:   Lipase 584 (*)    All other components within normal limits  URINALYSIS, ROUTINE W REFLEX MICROSCOPIC - Abnormal; Notable for the following components:   APPearance HAZY (*)    Hgb urine dipstick MODERATE (*)    Ketones, ur 20 (*)    Protein, ur 100 (*)    Bacteria, UA RARE (*)    All other components within normal limits  COMPREHENSIVE METABOLIC PANEL - Abnormal; Notable for the following components:   Sodium 127 (*)    Chloride 92 (*)    Glucose, Bld 113 (*)    BUN <5 (*)    All other components within  normal limits  CBC WITH DIFFERENTIAL/PLATELET  CBC WITH DIFFERENTIAL/PLATELET  LIPASE, BLOOD  TROPONIN I (HIGH SENSITIVITY)  TROPONIN I (HIGH SENSITIVITY)    EKG EKG Interpretation  Date/Time:  Friday July 12 2021 17:58:29 EDT Ventricular Rate:  71 PR Interval:  122 QRS Duration: 88 QT Interval:  374 QTC Calculation: 406 R Axis:   75 Text Interpretation: Normal sinus rhythm Minimal voltage criteria for LVH, may be normal variant ( Sokolow-Lyon ) Borderline ECG No previous ECGs available Confirmed by Alvira Monday ([id number]) on 07/13/2021 7:38:44 AM  Radiology CT ABDOMEN PELVIS W CONTRAST  Result Date: 07/13/2021 CLINICAL DATA:  Pancreatitis suspected. Chest and abdominal pain for several days. EXAM: CT ABDOMEN AND PELVIS WITH CONTRAST TECHNIQUE: Multidetector CT imaging of the abdomen and pelvis was performed using the standard protocol following bolus administration of intravenous contrast. CONTRAST:  OMNIPAQUE IOHEXOL 300 MG/ML  SOLN COMPARISON:  None. FINDINGS: Lower chest: No acute abnormality. Hepatobiliary: No focal liver abnormality is seen. No gallstones, gallbladder wall thickening, or biliary dilatation. Pancreas: Signs of acute on chronic pancreatitis identified. There is diffuse edema with mild peripancreatic inflammatory fat stranding involving the pancreas. Calcifications within the head of pancreas are noted reflecting changes due to chronic pancreatitis. Small cystic lesion within the anterior head of pancreas measures 5 mm and favored to represent small the chronic pseudocyst, image, image 28/3. No pancreatic mass or signs of main duct dilatation. Spleen: Normal in size without focal abnormality. Adrenals/Urinary Tract: Normal adrenal glands. No signs of kidney mass or hydronephrosis identified. The urinary bladder is unremarkable. Stomach/Bowel: Stomach appears normal. The appendix is visualized and is unremarkable. No bowel wall thickening, inflammation or distension.  Vascular/Lymphatic: Aortic atherosclerosis. No aneurysm. No abdominal or pelvic adenopathy. Reproductive: Prostate is unremarkable. Other: Small volume of free fluid tracks along the right pericolic gutter into the posterior pelvis. No suspicious fluid collections identified. Musculoskeletal: No acute or significant osseous findings. IMPRESSION: 1. Signs of acute on chronic pancreatitis. No complicating features identified. 2. Small volume of free fluid tracks along the right pericolic gutter into the posterior pelvis. No focal fluid collections identified. 3. Aortic atherosclerosis. 4. Small cystic lesion within the uncinate process measuring 6 mm is favored to represent small the chronic pseudocyst. Aortic Atherosclerosis (ICD10-I70.0). Electronically Signed   By: Signa Kell M.D.   On: 07/13/2021 09:07    Procedures Procedures   Medications  Ordered in ED Medications  oxyCODONE-acetaminophen (PERCOCET/ROXICET) 5-325 MG per tablet 1 tablet (1 tablet Oral Given 07/12/21 1809)  ondansetron (ZOFRAN-ODT) disintegrating tablet 4 mg (4 mg Oral Given 07/12/21 1809)  morphine 4 MG/ML injection 4 mg (4 mg Intravenous Given 07/13/21 0755)  ondansetron (ZOFRAN-ODT) disintegrating tablet 4 mg (4 mg Oral Given 07/13/21 0755)  lactated ringers bolus 1,000 mL (0 mLs Intravenous Stopped 07/13/21 0936)  iohexol (OMNIPAQUE) 300 MG/ML solution 100 mL (100 mLs Intravenous Contrast Given 07/13/21 0834)    ED Course  I have reviewed the triage vital signs and the nursing notes.  Pertinent labs & imaging results that were available during my care of the patient were reviewed by me and considered in my medical decision making (see chart for details).    MDM Rules/Calculators/A&P                           Patient is a 45 y.o. male without known PMH who presents with epigastric abdominal pain, and chest pain for the past 3 days. Abdominal pain radiates to back with associated nausea and vomiting. Describes CP as a  pressure sensation. On presentation BP elevated at 168/104. Abdomen tender to mild palpation diffusely. CT abdomen pelvis with signs of acute on chronic pancreatitis and 22mm chronic pseudocyst. EKG NSR  Trop neg x 2. Lipsae 854, Na 127, AST 44. CBC wnl. UA with moderate hgb, 20 ketones, 100 protein. Gave LR bolus x 1. Percocet x1, morphine 4mg , Zofran 4mg . Findings significant for acute pancreatitis. Patient was accepted for admission by Internal Medicine service.    Final Clinical Impression(s) / ED Diagnoses Final diagnoses:  None    Rx / DC Orders ED Discharge Orders     None        , DO 07/13/21 1046    Cora Collum, DO 07/13/21 1047    Cora Collum, MD 07/13/21 2340

## 2021-07-13 NOTE — ED Notes (Signed)
Pt ambulated to the bathroom with no issue. 

## 2021-07-13 NOTE — Hospital Course (Signed)
Acute pancreatitis  ETOH 3 beers 3x per week Cannibis CT acute on chronic pancreatitis.   PMH: chronic pancreatitis,

## 2021-07-14 ENCOUNTER — Observation Stay (HOSPITAL_COMMUNITY): Payer: Self-pay

## 2021-07-14 DIAGNOSIS — K852 Alcohol induced acute pancreatitis without necrosis or infection: Secondary | ICD-10-CM

## 2021-07-14 LAB — CBC
HCT: 39.3 % (ref 39.0–52.0)
Hemoglobin: 13.6 g/dL (ref 13.0–17.0)
MCH: 32.2 pg (ref 26.0–34.0)
MCHC: 34.6 g/dL (ref 30.0–36.0)
MCV: 93.1 fL (ref 80.0–100.0)
Platelets: 196 10*3/uL (ref 150–400)
RBC: 4.22 MIL/uL (ref 4.22–5.81)
RDW: 13.5 % (ref 11.5–15.5)
WBC: 6.9 10*3/uL (ref 4.0–10.5)
nRBC: 0 % (ref 0.0–0.2)

## 2021-07-14 LAB — BASIC METABOLIC PANEL
Anion gap: 12 (ref 5–15)
BUN: <5 mg/dL — ABNORMAL LOW (ref 6–20)
CO2: 24 mmol/L (ref 22–32)
Calcium: 9.3 mg/dL (ref 8.9–10.3)
Chloride: 95 mmol/L — ABNORMAL LOW (ref 98–111)
Creatinine, Ser: 0.76 mg/dL (ref 0.61–1.24)
GFR, Estimated: >60 mL/min (ref >60)
Glucose, Bld: 113 mg/dL — ABNORMAL HIGH (ref 70–99)
Potassium: 3.4 mmol/L — ABNORMAL LOW (ref 3.5–5.1)
Sodium: 131 mmol/L — ABNORMAL LOW (ref 135–145)

## 2021-07-14 NOTE — Progress Notes (Signed)
Patient has ordered for discharge. Given discharge instructions with paper to the patient. Iv removed. Given all belongings to the patient. 

## 2021-07-14 NOTE — Discharge Summary (Signed)
Name: Scott Mckee MRN: [id number] DOB: 12-31-1975 45 y.o. PCP: Patient, No Pcp Per (Inactive)  Date of Admission: 07/12/2021  5:56 PM Date of Discharge: 07/14/2021 Attending Physician: No att. providers found  Discharge Diagnosis: 1. Acute on chronic pancreatitis Cholelithiasis HTN Hyponatremia Alcohol use Tobacco use   Discharge Medications: Allergies as of 07/14/2021   No Known Allergies      Medication List     STOP taking these medications    naproxen sodium 220 MG tablet Commonly known as: ALEVE        Disposition and follow-up:   Mr.Scott Mckee was discharged from [facility] in Stable condition.  At the hospital follow up visit please address:  1.  Acute on chronic pancreatitis - 3 days abd pain radiates to back, CT scan showing acute on chronic pancreatitis with 6 mm psuedocyst within uncinate process. Elevated lipase 500s - Appears to be a chronic issue for him. He was evaluated 6 months ago in urgent care for similar issue. Suspect this may be secondary to underlying alcohol use. - patient has not had bowel movement in 2 days. Will give senokot  - pain management oxycodone 2.5 and tylenol - IVFs LR 58ml per hour - Advance diet as tolerated - zofran for nausea  Cholelithiasis - RUQ US showed cholelithiasis without acute cholecystitis nor biliary duct dilation. 38mm polyp noted within gallbladder presumed benign - On exam, patient was asymptomatic and wanting to progress diet at home. No complaints of pain. Surgical intervention was deferred by patient at this time.  - recommended patient follow up with general surgery as outpatient for potential cholecystectomy. Gen surg currently in process of scheduling office follow up  # alcohol use - drinks 3 beers three times per week. Last drink 2 days ago - history of shakes after abstaining - CIWA without ativan - Patient says he binge drank earlier in the week before he began to  develop symptoms of pancreatitis. His alcohol use is likely a large contributing factor to this hospitalization and contributing factor to his high blood pressure.  - Recommend use be addressed as an outpatient.   HTN (resolved) - 175/109 on admission, suspect secondary to alcohol use - now stable 126/89   # hyponatremia - 131, likely related to poor po intake in setting of pancreatitis.   Tobacco use - counsel on the importance of smoking cessation   Cannabis use  2.  Labs / imaging needed at time of follow-up: CBC, CMP  3.  Pending labs/ test needing follow-up: none  Follow-up Appointments:  Follow-up Information     Violeta Gelinas, MD. Call.   Specialty: General Surgery Why: we are working to make you an appointment for evaluation for possible cholecystectomy. Please call to confirm appointment date and time Contact information: [address] [place] [address] [phone]         [facility] Follow up in 1 week(s).   Contact information: [address] [place] [address] [phone]                Hospital Course by problem list: 1. Acute on chronic pancreatitis - Patient presented to the emergency department with 3 days of abdominal pain with associated nausea and vomiting as well as poor po intake. CT scan of the abd was performed and showed findings consistent with acute on chronic pancreatitis. Blood lipase levels were also noted to be elevated. He was started  on pain control IVFs, clear liquid diet and admitted for further management and investigation into the etiology of his pancreatitis. A RUQ utrasound was performed to evaluate for gallstone pancreatitis and did show cholelithiasis present, but was negative for any evidence of acute cholecystitis and no evidence of biliary duct dilation. Patient pancreatitis was presumed to be alcohol induced. His symptoms resolved overnight and he was deemed stable  for discharge. Patient deferred surgical intervention at this time and was told to follow up with general surgery as an outpatient for possible cholecystectomy. He was advised to limit/abstain from alcohol use due to recent hospitalization.  Cholelithiasis - RUQ [place] was performed to investigate for potential causes for patients pancreatitis. Gallstones were noted on exam, no biliary duct dilation or evidence of acute cholecystitis noted. Discussed findings with patient who deferred surgical intervention at this time. Surgery was consulted and evaluated patient prior to discharge. Patient is scheduled to follow up with surgery as an outpatient.  Alcohol use - patient drinks 3 beers three times per week, owever, he reported binge drinking prior to onset of pain and admission. He was monitored CIWA scale, but Ativan was not ordered as he denied ever having a seizure due to alcohol withdrawal. It is felt that his alcohol use is the primary driving factor for his pancreatitis as well as his high blood pressures throughout admission. He was advised to limit/abstain from future alcohol use.   Discharge Exam:   BP (!) 132/94 (BP Location: Left Arm)   Pulse 81   Temp 98.2 F (36.8 C) (Oral)   Resp 18   Ht 5\' 5"  (1.651 m)   Wt 59 kg   SpO2 99%   BMI 21.63 kg/m  Discharge exam: Physical Exam Constitutional:      Appearance: He is well-developed and normal weight.  HENT:     Head: Normocephalic and atraumatic.  Cardiovascular:     Rate and Rhythm: Normal rate and regular rhythm.     Heart sounds: Normal heart sounds.  Pulmonary:     Effort: Pulmonary effort is normal.     Breath sounds: Normal breath sounds.  Abdominal:     General: Bowel sounds are normal.     Palpations: Abdomen is soft.  Musculoskeletal:        General: Normal range of motion.  Skin:    General: Skin is warm and dry.  Neurological:     General: No focal deficit present.     Mental Status: He is alert and oriented to  person, place, and time.  Psychiatric:        Mood and Affect: Mood normal.        Behavior: Behavior normal.     Pertinent Labs, Studies, and Procedures:  CBC Latest Ref Rng & Units 07/14/2021 07/13/2021 07/12/2021  WBC 4.0 - 10.5 K/uL 6.9 8.1 8.8  Hemoglobin 13.0 - 17.0 g/dL 07/14/2021 52.7 78.2  Hematocrit 39.0 - 52.0 % 39.3 41.8 40.9  Platelets 150 - 400 K/uL 196 228 248   CMP Latest Ref Rng & Units 07/14/2021 07/13/2021 07/12/2021  Glucose 70 - 99 mg/dL 07/14/2021) 536(R) 443(X)  BUN 6 - 20 mg/dL 540(G) <8(Q) <7(Y)  Creatinine 0.61 - 1.24 mg/dL <1(P 5.09 3.26  Sodium 135 - 145 mmol/L 131(L) 127(L) 127(L)  Potassium 3.5 - 5.1 mmol/L 3.4(L) 3.7 3.6  Chloride 98 - 111 mmol/L 95(L) 92(L) 91(L)  CO2 22 - 32 mmol/L 24 23 24   Calcium 8.9 - 10.3 mg/dL 9.3 9.6 9.7  Total Protein 6.5 - 8.1 g/dL - 7.5 7.5  Total Bilirubin 0.3 - 1.2 mg/dL - 1.0 0.9  Alkaline Phos 38 - 126 U/L - 124 125  AST 15 - 41 U/L - 41 44(H)  ALT 0 - 44 U/L - 31 34   Urinalysis    Component Value Date/Time   COLORURINE YELLOW 07/12/2021 1808   APPEARANCEUR HAZY (A) 07/12/2021 1808   LABSPEC 1.016 07/12/2021 1808   PHURINE 5.0 07/12/2021 1808   GLUCOSEU NEGATIVE 07/12/2021 1808   HGBUR MODERATE (A) 07/12/2021 1808   BILIRUBINUR NEGATIVE 07/12/2021 1808   KETONESUR 20 (A) 07/12/2021 1808   PROTEINUR 100 (A) 07/12/2021 1808   NITRITE NEGATIVE 07/12/2021 1808   LEUKOCYTESUR NEGATIVE 07/12/2021 1808   Lipase     Component Value Date/Time   LIPASE 408 (H) 07/13/2021 0743     CT ABDOMEN PELVIS W CONTRAST  Result Date: 07/13/2021 CLINICAL DATA:  Pancreatitis suspected. Chest and abdominal pain for several days. EXAM: CT ABDOMEN AND PELVIS WITH CONTRAST TECHNIQUE: Multidetector CT imaging of the abdomen and pelvis was performed using the standard protocol following bolus administration of intravenous contrast. CONTRAST:  OMNIPAQUE IOHEXOL 300 MG/ML  SOLN COMPARISON:  None. FINDINGS: Lower chest: No acute abnormality.  Hepatobiliary: No focal liver abnormality is seen. No gallstones, gallbladder wall thickening, or biliary dilatation. Pancreas: Signs of acute on chronic pancreatitis identified. There is diffuse edema with mild peripancreatic inflammatory fat stranding involving the pancreas. Calcifications within the head of pancreas are noted reflecting changes due to chronic pancreatitis. Small cystic lesion within the anterior head of pancreas measures 5 mm and favored to represent small the chronic pseudocyst, image, image 28/3. No pancreatic mass or signs of main duct dilatation. Spleen: Normal in size without focal abnormality. Adrenals/Urinary Tract: Normal adrenal glands. No signs of kidney mass or hydronephrosis identified. The urinary bladder is unremarkable. Stomach/Bowel: Stomach appears normal. The appendix is visualized and is unremarkable. No bowel wall thickening, inflammation or distension. Vascular/Lymphatic: Aortic atherosclerosis. No aneurysm. No abdominal or pelvic adenopathy. Reproductive: Prostate is unremarkable. Other: Small volume of free fluid tracks along the right pericolic gutter into the posterior pelvis. No suspicious fluid collections identified. Musculoskeletal: No acute or significant osseous findings. IMPRESSION: 1. Signs of acute on chronic pancreatitis. No complicating features identified. 2. Small volume of free fluid tracks along the right pericolic gutter into the posterior pelvis. No focal fluid collections identified. 3. Aortic atherosclerosis. 4. Small cystic lesion within the uncinate process measuring 6 mm is favored to represent small the chronic pseudocyst. Aortic Atherosclerosis (ICD10-I70.0). Electronically Signed   By: Signa Kell M.D.   On: 07/13/2021 09:07     Discharge Instructions: Discharge Instructions     Call MD for:  difficulty breathing, headache or visual disturbances   Complete by: As directed    Call MD for:  extreme fatigue   Complete by: As directed     Call MD for:  persistant dizziness or light-headedness   Complete by: As directed    Call MD for:  persistant nausea and vomiting   Complete by: As directed    Call MD for:  severe uncontrolled pain   Complete by: As directed    Call MD for:  temperature >100.4   Complete by: As directed    Diet - low sodium heart healthy   Complete by: As directed    Discharge instructions   Complete by: As directed    Dear Mr. Fawaz,  You were recently admitted  to [facility] for management of acute pancreatitis. During evaluation in the ED, it was discovered that you have an element of chronic pancreatitis. An ultrasound was performed to evaluate for potential causes of the pancreatitis, and you were found to have some gallstones. It is recommended that you follow up with a general surgeon for further evaluation of these gallstones. Alcohol use is another potential cause of pancreatitis. Given your history of alcohol use, it is possible this contributed to your pancreatitis, and for this reason it is recommended that you abstain from further alcohol use. Please follow up in the [facility] in 1 week.   Increase activity slowly   Complete by: As directed       You were recently admitted to [facility] for management of acute pancreatitis. During evaluation in the ED, it was discovered that you have an element of chronic pancreatitis. An ultrasound was performed to evaluate for potential causes of the pancreatitis, and you were found to have some gallstones. It is recommended that you follow up with a general surgeon for further evaluation of these gallstones. Alcohol use is another potential cause of pancreatitis. Given your history of alcohol use, it is possible this contributed to your pancreatitis, and for this reason it is recommended that you abstain from further alcohol use. Please follow up in the [facility] in 1  week.  Signed: Adron BeneGawaluck, Aasia Peavler, MD 07/14/2021, 2:42 PM   Pager: @[phone] @

## 2021-07-14 NOTE — Discharge Instructions (Addendum)
Dear Scott Mckee,  You were recently admitted to [facility] for management of acute pancreatitis. During evaluation in the ED, it was discovered that you have an element of chronic pancreatitis. An ultrasound was performed to evaluate for potential causes of the pancreatitis, and you were found to have some gallstones. It is recommended that you follow up with a general surgeon for further evaluation of these gallstones. Alcohol use is another potential cause of pancreatitis. Given your history of alcohol use, it is possible this contributed to your pancreatitis, and for this reason it is recommended that you abstain from further alcohol use. Please follow up in the [facility] in 1 week.

## 2021-07-16 ENCOUNTER — Telehealth: Payer: Self-pay | Admitting: Internal Medicine

## 2021-07-16 NOTE — Telephone Encounter (Signed)
TOC HFU no appointment scheduled.  Patient needs an appointment after 4 pm due to work schedule and does not want to take any more time off since he was in the hospital. Will call back if he gets a day off and schedule an appointment.

## 2021-07-16 NOTE — Telephone Encounter (Signed)
-----   Message from [name], MD sent at 07/14/2021 10:52 AM EDT ----- This patient needs to be scheduled for hospital follow up visit in 1 week. He may establish care.   Thanks,  [name], MD

## 2022-04-05 ENCOUNTER — Encounter (HOSPITAL_COMMUNITY): Payer: Self-pay

## 2022-04-05 ENCOUNTER — Other Ambulatory Visit: Payer: Self-pay

## 2022-04-05 ENCOUNTER — Emergency Department (HOSPITAL_COMMUNITY)
Admission: EM | Admit: 2022-04-05 | Discharge: 2022-04-06 | Disposition: A | Discharge status: Home / Self Care | Payer: Self-pay | Attending: Emergency Medicine | Admitting: Emergency Medicine

## 2022-04-05 DIAGNOSIS — Y908 Blood alcohol level of 240 mg/100 ml or more: Secondary | ICD-10-CM | POA: Insufficient documentation

## 2022-04-05 DIAGNOSIS — W108XXA Fall (on) (from) other stairs and steps, initial encounter: Secondary | ICD-10-CM | POA: Insufficient documentation

## 2022-04-05 DIAGNOSIS — S0266XB Fracture of symphysis of mandible, initial encounter for open fracture: Secondary | ICD-10-CM | POA: Insufficient documentation

## 2022-04-05 DIAGNOSIS — Z23 Encounter for immunization: Secondary | ICD-10-CM | POA: Insufficient documentation

## 2022-04-05 DIAGNOSIS — S02609B Fracture of mandible, unspecified, initial encounter for open fracture: Secondary | ICD-10-CM

## 2022-04-05 DIAGNOSIS — R000 Tachycardia, unspecified: Secondary | ICD-10-CM | POA: Insufficient documentation

## 2022-04-05 DIAGNOSIS — F17200 Nicotine dependence, unspecified, uncomplicated: Secondary | ICD-10-CM | POA: Insufficient documentation

## 2022-04-05 MED ORDER — TETANUS-DIPHTH-ACELL PERTUSSIS 5-2.5-18.5 LF-MCG/0.5 IM SUSY
0.5000 mL | PREFILLED_SYRINGE | Freq: Once | INTRAMUSCULAR | Status: AC
  Administered 2022-04-06: 0.5 mL via INTRAMUSCULAR
  Filled 2022-04-05: qty 0.5

## 2022-04-05 NOTE — ED Provider Triage Note (Signed)
Emergency Medicine Provider Triage Evaluation Note ? ?Scott Mckee , a 46 y.o. male  was evaluated in triage.  Pt complains of syncopal episode that occurred earlier today.  Patient does admit that he has been drinking earlier today.  He reports that he had walked up and down the steps and then back again.  He states that he was midway down the steps can after dropping something off upstairs when he found himself at the bottom of the staircase.  He believes he passed out.  He reports that 2 of his teeth are missing.  He did not notice them on the floor is unsure where they went.  He also has a laceration to his tongue.  He is unsure regarding tetanus status.  He denies issues with syncope in the past.  Denies any prodrome. ? ?Review of Systems  ?Positive: + syncope, facial injury, tongue laceration ?Negative:  ? ?Physical Exam  ?BP (!) 152/95   Pulse 90   Temp 98.4 ?F (36.9 ?C) (Oral)   Resp 18   SpO2 100%  ?Gen:   Awake, no distress   ?Resp:  Normal effort  ?MSK:   Moves extremities without difficulty  ?Other:  A & O x 4. Laceration to distal aspect of tongue on R side; through and through. 2 missing teeth to upper right side.  ? ?Medical Decision Making  ?Medically screening exam initiated at 11:49 PM.  Appropriate orders placed.  Scott Mckee was informed that the remainder of the evaluation will be completed by another provider, this initial triage assessment does not replace that evaluation, and the importance of remaining in the ED until their evaluation is complete. ? ? ?  ?Tanda Rockers, PA-C ?04/05/22 2352 ? ?

## 2022-04-06 ENCOUNTER — Emergency Department (HOSPITAL_COMMUNITY): Payer: Self-pay

## 2022-04-06 LAB — CBC WITH DIFFERENTIAL/PLATELET
Abs Immature Granulocytes: 0.06 10*3/uL (ref 0.00–0.07)
Basophils Absolute: 0.1 10*3/uL (ref 0.0–0.1)
Basophils Relative: 0 %
Eosinophils Absolute: 0 10*3/uL (ref 0.0–0.5)
Eosinophils Relative: 0 %
HCT: 38.4 % — ABNORMAL LOW (ref 39.0–52.0)
Hemoglobin: 13 g/dL (ref 13.0–17.0)
Immature Granulocytes: 1 %
Lymphocytes Relative: 9 %
Lymphs Abs: 1.1 10*3/uL (ref 0.7–4.0)
MCH: 32.1 pg (ref 26.0–34.0)
MCHC: 33.9 g/dL (ref 30.0–36.0)
MCV: 94.8 fL (ref 80.0–100.0)
Monocytes Absolute: 0.7 10*3/uL (ref 0.1–1.0)
Monocytes Relative: 6 %
Neutro Abs: 10.2 10*3/uL — ABNORMAL HIGH (ref 1.7–7.7)
Neutrophils Relative %: 84 %
Platelets: 300 10*3/uL (ref 150–400)
RBC: 4.05 MIL/uL — ABNORMAL LOW (ref 4.22–5.81)
RDW: 15.6 % — ABNORMAL HIGH (ref 11.5–15.5)
WBC: 12.1 10*3/uL — ABNORMAL HIGH (ref 4.0–10.5)
nRBC: 0 % (ref 0.0–0.2)

## 2022-04-06 LAB — BASIC METABOLIC PANEL
Anion gap: 16 — ABNORMAL HIGH (ref 5–15)
BUN: 5 mg/dL — ABNORMAL LOW (ref 6–20)
CO2: 16 mmol/L — ABNORMAL LOW (ref 22–32)
Calcium: 9 mg/dL (ref 8.9–10.3)
Chloride: 104 mmol/L (ref 98–111)
Creatinine, Ser: 0.56 mg/dL — ABNORMAL LOW (ref 0.61–1.24)
GFR, Estimated: >60 mL/min (ref >60)
Glucose, Bld: 144 mg/dL — ABNORMAL HIGH (ref 70–99)
Potassium: 3.6 mmol/L (ref 3.5–5.1)
Sodium: 136 mmol/L (ref 135–145)

## 2022-04-06 LAB — ETHANOL: Alcohol, Ethyl (B): 291 mg/dL — ABNORMAL HIGH (ref <10)

## 2022-04-06 MED ORDER — HYDROMORPHONE HCL 1 MG/ML IJ SOLN
0.5000 mg | Freq: Once | INTRAMUSCULAR | Status: AC
  Administered 2022-04-06: 0.5 mg via INTRAMUSCULAR
  Filled 2022-04-06: qty 1

## 2022-04-06 MED ORDER — AMOXICILLIN-POT CLAVULANATE 875-125 MG PO TABS: 1.0000 | ORAL_TABLET | Freq: Two times a day (BID) | ORAL | 0 refills | Status: AC

## 2022-04-06 MED ORDER — OXYCODONE HCL 5 MG PO TABS: 5.0000 mg | ORAL_TABLET | ORAL | 0 refills | Status: DC | PRN

## 2022-04-06 NOTE — Discharge Instructions (Addendum)
You were evaluated in the Emergency Department and after careful evaluation, we did not find any emergent condition requiring admission or further testing in the hospital. ? ?Your CT scans showed a broken jaw bone on both sides.  It is very important that you follow-up with the surgeon.  You will likely need surgery.  Please take the antibiotics as directed.  Recommend Tylenol 1000 mg every 4-6 hours and/or Motrin 600 mg every 4-6 hours for pain.  You can use the oxycodone medication for more significant pain. ? ?Please return to the Emergency Department if you experience any worsening of your condition.  Thank you for allowing [place] to be a part of your care. ? ?

## 2022-04-06 NOTE — ED Notes (Signed)
Pt is agitated due to pain and claustrophobia from being in the room. This RN opened door. Pt stated he is ready to leave. Rn informed pt that he is not up for discharge. Pt agreed to wait a while longer ?

## 2022-04-06 NOTE — ED Triage Notes (Signed)
Pt complains of syncopal episode that occurred earlier today.  Patient does admit that he has been drinking earlier today.  He reports that he had walked up and down the steps and then back again.  He states that he was midway down the steps can after dropping something off upstairs when he found himself at the bottom of the staircase.  He believes he passed out.  He reports that 2 of his teeth are missing.  He did not notice them on the floor is unsure where they went.  He also has a laceration to his tongue.  He is unsure regarding tetanus status.  He denies issues with syncope in the past.  ?

## 2022-04-06 NOTE — ED Provider Notes (Signed)
?[facility]ERGENCY DEPT ?[facility] Emergency Department ?Provider Note ?MRN:  [id number]  ?Arrival date & time: 04/06/22    ? ?Chief Complaint   ?Loss of Consciousness ?  ?History of Present Illness   ?Scott Mckee is a 46 y.o. year-old male with no pertinent past medical presenting to the ED with chief complaint of loss of consciousness. ? ?Patient explains that he blacked out and fell down 4 stairs.  Was drinking heavily this evening.  Endorsing a lot of pain to the face, jaw.  Obvious swelling.  No chest pain or shortness of breath, no abdominal pain, no neck or back pain, no extremity injuries. ? ?Review of Systems  ?A thorough review of systems was obtained and all systems are negative except as noted in the HPI and PMH.  ? ?Patient's Health History   ?History reviewed. No pertinent past medical history.  ?History reviewed. No pertinent surgical history.  ?History reviewed. No pertinent family history.  ?Social History  ? ?Socioeconomic History  ? Marital status: Single  ?  Spouse name: Not on file  ? Number of children: Not on file  ? Years of education: Not on file  ? Highest education level: Not on file  ?Occupational History  ? Not on file  ?Tobacco Use  ? Smoking status: Every Day  ? Smokeless tobacco: Never  ?Substance and Sexual Activity  ? Alcohol use: Yes  ? Drug use: No  ? Sexual activity: Not on file  ?Other Topics Concern  ? Not on file  ?Social History Narrative  ? Not on file  ? ?Social Determinants of Health  ? ?Financial Resource Strain: Not on file  ?Food Insecurity: Not on file  ?Transportation Needs: Not on file  ?Physical Activity: Not on file  ?Stress: Not on file  ?Social Connections: Not on file  ?Intimate Partner Violence: Not on file  ?  ? ?Physical Exam  ? ?Vitals:  ? 04/05/22 2340 04/06/22 0145  ?BP: (!) 152/95 (!) 176/99  ?Pulse: 90 (!) 118  ?Resp: 18 17  ?Temp: 98.4 ?F (36.9 ?C)   ?SpO2: 100% 100%  ?  ?CONSTITUTIONAL: Well-appearing, NAD ?NEURO/PSYCH:  Alert and oriented x 3,  no focal deficits ?EYES:  eyes equal and reactive ?ENT/NECK: Significant swelling to the mandible bilaterally, nearly full avulsion of the tip of the tongue ?CARDIO: Regular rate, well-perfused, normal S1 and S2 ?PULM:  CTAB no wheezing or rhonchi ?GI/GU:  non-distended, non-tender ?MSK/SPINE:  No gross deformities, no edema ?SKIN:  no rash, atraumatic ? ? ?*Additional and/or pertinent findings included in MDM below ? ?Diagnostic and Interventional Summary  ? ? EKG Interpretation ? ?Date/Time:  Saturday April 05 2022 23:57:09 EDT ?Ventricular Rate:  101 ?PR Interval:  128 ?QRS Duration: 82 ?QT Interval:  320 ?QTC Calculation: 414 ?R Axis:   77 ?Text Interpretation: Sinus tachycardia Nonspecific T wave abnormality Abnormal ECG When compared with ECG of 12-Jul-2021 17:58, PREVIOUS ECG IS PRESENT Confirmed by Kennis Carina [phone]) on 04/06/2022 1:08:40 AM ?  ? ?  ? ?Labs Reviewed  ?CBC WITH DIFFERENTIAL/PLATELET - Abnormal; Notable for the following components:  ?    Result Value  ? WBC 12.1 (*)   ? RBC 4.05 (*)   ? HCT 38.4 (*)   ? RDW 15.6 (*)   ? Neutro Abs 10.2 (*)   ? All other components within normal limits  ?BASIC METABOLIC PANEL - Abnormal; Notable for the following components:  ? CO2 16 (*)   ? Glucose, Bld 144 (*)   ?  BUN 5 (*)   ? Creatinine, Ser 0.56 (*)   ? Anion gap 16 (*)   ? All other components within normal limits  ?ETHANOL - Abnormal; Notable for the following components:  ? Alcohol, Ethyl (B) 291 (*)   ? All other components within normal limits  ?  ?CT Head Wo Contrast  ?Final Result  ?  ?CT Maxillofacial Wo Contrast  ?Final Result  ?  ?CT Cervical Spine Wo Contrast  ?Final Result  ?  ?DG Chest 2 View  ?Final Result  ?  ?  ?Medications  ?Tdap (BOOSTRIX) injection 0.5 mL (0.5 mLs Intramuscular Given 04/06/22 0029)  ?HYDROmorphone (DILAUDID) injection 0.5 mg (0.5 mg Intramuscular Given 04/06/22 0159)  ?  ? ?Procedures  /  Critical Care ?Procedures ? ?ED Course and Medical Decision Making  ?Initial  Impression and Ddx ?Fall with facial trauma, concern for mandible injury.  Patient also has laceration in nearly a full avulsion of the tip of the tongue.  Intra nasal bleeding, cervical spinal fracture also considered.  Patient endorsing a blackout spell, doubt syncope, favoring blacking out in the setting of heavy alcohol use.  EKG is reassuring with normal intervals ? ?Past medical/surgical history that increases complexity of ED encounter: None ? ?Interpretation of Diagnostics ?I personally reviewed the EKG and my interpretation is as follows: Sinus tachycardia ?   ?CT imaging revealing significant mandibular injuries, fracture bilateral necks with anterior TMJ dislocation. ? ?Patient Reassessment and Ultimate Disposition/Management ?Case discussed with Dr. Steffanie Dunn pace who is on-call for mandible trauma.  Emergent fixation not indicated, patient can follow-up in the office. ? ?Discussed with patient repair of the tongue.  Patient wants to go home, not interested in laceration repair at this time.  Overall I suspect that the tip of this tongue will either fall off or can be excised in the office with ENT. ? ?Patient management required discussion with the following services or consulting groups:  ENT/Plastic Surgery ? ?Complexity of Problems Addressed ?Acute illness or injury that poses threat of life of bodily function ? ?Additional Data Reviewed and Analyzed ?Further history obtained from: ?None ? ?Additional Factors Impacting ED Encounter Risk ?Use of parenteral controlled substances ? ?Elmer Sow. Pilar Plate, MD ?[facility] Emergency Medicine ?[facility] [email or web address] ? ?Final Clinical Impressions(s) / ED Diagnoses  ? ?  ICD-10-CM   ?1. Open fracture of mandible, unspecified laterality, unspecified mandibular site, initial encounter (HCC)  S02.609B   ?  ?  ?ED Discharge Orders   ? ?      Ordered  ?  amoxicillin-clavulanate (AUGMENTIN) 875-125 MG tablet  Every 12 hours       ? 04/06/22 0329   ?  oxyCODONE (ROXICODONE) 5 MG immediate release tablet  Every 4 hours PRN       ? 04/06/22 0329  ? ?  ?  ? ?  ?  ? ?Discharge Instructions Discussed with and Provided to Patient:  ? ? ?Discharge Instructions   ? ?  ?You were evaluated in the Emergency Department and after careful evaluation, we did not find any emergent condition requiring admission or further testing in the hospital. ? ?Your CT scans showed a broken jaw bone on both sides.  It is very important that you follow-up with the surgeon.  You will likely need surgery.  Please take the antibiotics as directed.  Recommend Tylenol 1000 mg every 4-6 hours and/or Motrin 600 mg every 4-6 hours for pain.  You can use the oxycodone  medication for more significant pain. ? ?Please return to the Emergency Department if you experience any worsening of your condition.  Thank you for allowing us to be a part of your care. ? ? ? ? ?  ?Sabas SousBero, Terrall Bley M, MD ?04/06/22 [phone] ? ?

## 2022-04-06 NOTE — ED Notes (Signed)
RN reviewed discharge instructions with pt. Pt verbalized understanding and had no further questions. VSS upon discharge.  

## 2022-04-07 ENCOUNTER — Ambulatory Visit (HOSPITAL_COMMUNITY): Payer: Self-pay | Admitting: Anesthesiology

## 2022-04-07 ENCOUNTER — Encounter (HOSPITAL_COMMUNITY): Payer: Self-pay | Admitting: Plastic Surgery

## 2022-04-07 ENCOUNTER — Other Ambulatory Visit: Payer: Self-pay

## 2022-04-07 ENCOUNTER — Ambulatory Visit (HOSPITAL_BASED_OUTPATIENT_CLINIC_OR_DEPARTMENT_OTHER): Payer: Self-pay | Admitting: Anesthesiology

## 2022-04-07 ENCOUNTER — Encounter (HOSPITAL_COMMUNITY)
Admission: RE | Disposition: A | Discharge status: Home / Self Care | Payer: Self-pay | Source: Home / Self Care | Attending: Plastic Surgery

## 2022-04-07 ENCOUNTER — Ambulatory Visit (HOSPITAL_COMMUNITY)
Admission: RE | Admit: 2022-04-07 | Discharge: 2022-04-07 | Disposition: A | Discharge status: Home / Self Care | Payer: Self-pay | Attending: Plastic Surgery | Admitting: Plastic Surgery

## 2022-04-07 DIAGNOSIS — S02622A Fracture of subcondylar process of left mandible, initial encounter for closed fracture: Secondary | ICD-10-CM | POA: Insufficient documentation

## 2022-04-07 DIAGNOSIS — S02601A Fracture of unspecified part of body of right mandible, initial encounter for closed fracture: Secondary | ICD-10-CM

## 2022-04-07 DIAGNOSIS — F17200 Nicotine dependence, unspecified, uncomplicated: Secondary | ICD-10-CM | POA: Insufficient documentation

## 2022-04-07 DIAGNOSIS — W109XXA Fall (on) (from) unspecified stairs and steps, initial encounter: Secondary | ICD-10-CM | POA: Insufficient documentation

## 2022-04-07 HISTORY — PX: ORIF MANDIBULAR FRACTURE: SHX2127

## 2022-04-07 SURGERY — OPEN REDUCTION INTERNAL FIXATION (ORIF) MANDIBULAR FRACTURE
Anesthesia: General | Site: Mouth

## 2022-04-07 MED ORDER — FENTANYL CITRATE (PF) 250 MCG/5ML IJ SOLN
INTRAMUSCULAR | Status: AC
  Filled 2022-04-07: qty 5

## 2022-04-07 MED ORDER — LIDOCAINE-EPINEPHRINE 1 %-1:100000 IJ SOLN
INTRAMUSCULAR | Status: AC
  Filled 2022-04-07: qty 1

## 2022-04-07 MED ORDER — CHLORHEXIDINE GLUCONATE 0.12 % MT SOLN: 15.0000 mL | Freq: Two times a day (BID) | OROMUCOSAL | 0 refills | Status: DC

## 2022-04-07 MED ORDER — OXYCODONE HCL 5 MG PO TABS: 5.0000 mg | ORAL_TABLET | ORAL | 0 refills | Status: DC | PRN

## 2022-04-07 MED ORDER — ONDANSETRON HCL 4 MG/2ML IJ SOLN: 4.0000 mg | Freq: Once | INTRAMUSCULAR | Status: DC | PRN

## 2022-04-07 MED ORDER — DEXAMETHASONE SODIUM PHOSPHATE 10 MG/ML IJ SOLN
INTRAMUSCULAR | Status: DC | PRN
  Administered 2022-04-07: 10 mg via INTRAVENOUS

## 2022-04-07 MED ORDER — SUCCINYLCHOLINE CHLORIDE 200 MG/10ML IV SOSY
PREFILLED_SYRINGE | INTRAVENOUS | Status: DC | PRN
  Administered 2022-04-07: 80 mg via INTRAVENOUS

## 2022-04-07 MED ORDER — AMISULPRIDE (ANTIEMETIC) 5 MG/2ML IV SOLN: 10.0000 mg | Freq: Once | INTRAVENOUS | Status: DC | PRN

## 2022-04-07 MED ORDER — OXYCODONE HCL 5 MG/5ML PO SOLN: 5.0000 mg | Freq: Once | ORAL | Status: DC | PRN

## 2022-04-07 MED ORDER — FENTANYL CITRATE (PF) 250 MCG/5ML IJ SOLN
INTRAMUSCULAR | Status: DC | PRN
Start: 2022-04-07 — End: 2022-04-07
  Administered 2022-04-07: 50 ug via INTRAVENOUS
  Administered 2022-04-07: 100 ug via INTRAVENOUS
  Administered 2022-04-07: 150 ug via INTRAVENOUS

## 2022-04-07 MED ORDER — DEXMEDETOMIDINE (PRECEDEX) IN NS 20 MCG/5ML (4 MCG/ML) IV SYRINGE
PREFILLED_SYRINGE | INTRAVENOUS | Status: AC
  Filled 2022-04-07: qty 5

## 2022-04-07 MED ORDER — 0.9 % SODIUM CHLORIDE (POUR BTL) OPTIME
TOPICAL | Status: DC | PRN
  Administered 2022-04-07: 1000 mL

## 2022-04-07 MED ORDER — ONDANSETRON 4 MG PO TBDP: 4.0000 mg | ORAL_TABLET | Freq: Three times a day (TID) | ORAL | 0 refills | Status: DC | PRN

## 2022-04-07 MED ORDER — ONDANSETRON HCL 4 MG/2ML IJ SOLN
INTRAMUSCULAR | Status: DC | PRN
  Administered 2022-04-07: 4 mg via INTRAVENOUS

## 2022-04-07 MED ORDER — PROPOFOL 500 MG/50ML IV EMUL
INTRAVENOUS | Status: DC | PRN
  Administered 2022-04-07: 100 ug/kg/min via INTRAVENOUS

## 2022-04-07 MED ORDER — CEFAZOLIN SODIUM-DEXTROSE 2-4 GM/100ML-% IV SOLN
2.0000 g | INTRAVENOUS | Status: AC
  Administered 2022-04-07: 2 g via INTRAVENOUS
  Filled 2022-04-07: qty 100

## 2022-04-07 MED ORDER — CHLORHEXIDINE GLUCONATE 0.12 % MT SOLN: 15.0000 mL | Freq: Once | OROMUCOSAL | Status: DC

## 2022-04-07 MED ORDER — PROPOFOL 10 MG/ML IV BOLUS
INTRAVENOUS | Status: AC
  Filled 2022-04-07: qty 20

## 2022-04-07 MED ORDER — MIDAZOLAM HCL 2 MG/2ML IJ SOLN
INTRAMUSCULAR | Status: AC
  Filled 2022-04-07: qty 2

## 2022-04-07 MED ORDER — LIDOCAINE-EPINEPHRINE 1 %-1:100000 IJ SOLN
INTRAMUSCULAR | Status: DC | PRN
  Administered 2022-04-07: 20 mL

## 2022-04-07 MED ORDER — HYDRALAZINE HCL 20 MG/ML IJ SOLN
INTRAMUSCULAR | Status: AC
  Filled 2022-04-07: qty 1

## 2022-04-07 MED ORDER — PROPOFOL 10 MG/ML IV BOLUS
INTRAVENOUS | Status: DC | PRN
Start: 2022-04-07 — End: 2022-04-07
  Administered 2022-04-07: 150 mg via INTRAVENOUS

## 2022-04-07 MED ORDER — KETAMINE HCL 10 MG/ML IJ SOLN
INTRAMUSCULAR | Status: DC | PRN
  Administered 2022-04-07: 30 mg via INTRAVENOUS

## 2022-04-07 MED ORDER — LIDOCAINE 2% (20 MG/ML) 5 ML SYRINGE
INTRAMUSCULAR | Status: DC | PRN
  Administered 2022-04-07: 60 mg via INTRAVENOUS

## 2022-04-07 MED ORDER — FENTANYL CITRATE (PF) 100 MCG/2ML IJ SOLN
25.0000 ug | INTRAMUSCULAR | Status: DC | PRN
  Administered 2022-04-07 (×2): 50 ug via INTRAVENOUS

## 2022-04-07 MED ORDER — KETAMINE HCL 50 MG/5ML IJ SOSY
PREFILLED_SYRINGE | INTRAMUSCULAR | Status: AC
  Filled 2022-04-07: qty 5

## 2022-04-07 MED ORDER — ROCURONIUM BROMIDE 10 MG/ML (PF) SYRINGE
PREFILLED_SYRINGE | INTRAVENOUS | Status: DC | PRN
  Administered 2022-04-07: 50 mg via INTRAVENOUS

## 2022-04-07 MED ORDER — HYDRALAZINE HCL 20 MG/ML IJ SOLN
10.0000 mg | Freq: Once | INTRAMUSCULAR | Status: AC
  Administered 2022-04-07: 10 mg via INTRAVENOUS

## 2022-04-07 MED ORDER — MIDAZOLAM HCL 2 MG/2ML IJ SOLN
INTRAMUSCULAR | Status: DC | PRN
  Administered 2022-04-07: 2 mg via INTRAVENOUS

## 2022-04-07 MED ORDER — OXYCODONE HCL 5 MG PO TABS: 5.0000 mg | ORAL_TABLET | Freq: Once | ORAL | Status: DC | PRN

## 2022-04-07 MED ORDER — FENTANYL CITRATE (PF) 100 MCG/2ML IJ SOLN
INTRAMUSCULAR | Status: AC
  Filled 2022-04-07: qty 2

## 2022-04-07 MED ORDER — LACTATED RINGERS IV SOLN: INTRAVENOUS | Status: DC

## 2022-04-07 MED ORDER — OXYMETAZOLINE HCL 0.05 % NA SOLN
NASAL | Status: AC
  Filled 2022-04-07: qty 30

## 2022-04-07 MED ORDER — ORAL CARE MOUTH RINSE: 15.0000 mL | Freq: Once | OROMUCOSAL | Status: DC

## 2022-04-07 SURGICAL SUPPLY — 42 items
BAG COUNTER SPONGE SURGICOUNT (BAG) ×3 IMPLANT
BAG SPNG CNTER NS LX DISP (BAG) ×1
BIT DRILL 1.6X5 (BIT) ×1
BIT DRILL 1.6X5MM (BIT) IMPLANT
BIT DRILL RAINBOW 1.6X35 (BIT) ×1 IMPLANT
BLADE CLIPPER SURG (BLADE) IMPLANT
CANISTER SUCT 3000ML PPV (MISCELLANEOUS) ×3 IMPLANT
CLEANER TIP ELECTROSURG 2X2 (MISCELLANEOUS) ×3 IMPLANT
COVER SURGICAL LIGHT HANDLE (MISCELLANEOUS) ×3 IMPLANT
DRILL BIT 1.6X5MM (BIT) ×2
ELECT REM PT RETURN 9FT ADLT (ELECTROSURGICAL) ×2
ELECTRODE REM PT RTRN 9FT ADLT (ELECTROSURGICAL) ×2 IMPLANT
GLOVE BIOGEL M STRL SZ7.5 (GLOVE) ×3 IMPLANT
GLOVE INDICATOR 8.0 STRL GRN (GLOVE) ×3 IMPLANT
GOWN STRL REUS W/ TWL LRG LVL3 (GOWN DISPOSABLE) ×4 IMPLANT
GOWN STRL REUS W/TWL LRG LVL3 (GOWN DISPOSABLE) ×4
KIT BASIN OR (CUSTOM PROCEDURE TRAY) ×3 IMPLANT
KIT TURNOVER KIT B (KITS) ×3 IMPLANT
NS IRRIG 1000ML POUR BTL (IV SOLUTION) ×3 IMPLANT
PAD ARMBOARD 7.5X6 YLW CONV (MISCELLANEOUS) ×6 IMPLANT
PENCIL BUTTON HOLSTER BLD 10FT (ELECTRODE) ×3 IMPLANT
PLATE 4 H MINI W/BAR (Plate) ×1 IMPLANT
PLATE MNDBLE FRACTURE 4H (Plate) ×1 IMPLANT
POSITIONER HEAD DONUT 9IN (MISCELLANEOUS) ×3 IMPLANT
PROTECTOR CORNEAL (OPHTHALMIC RELATED) IMPLANT
SCISSORS WIRE ANG 4 3/4 DISP (INSTRUMENTS) ×3 IMPLANT
SCREW BONE CROSS PIN 2.0X05MM (Screw) ×4 IMPLANT
SCREW BONE CROSS PIN 2.0X14 (Screw) ×4 IMPLANT
SPLINT NASAL DOYLE BI-VL (GAUZE/BANDAGES/DRESSINGS) IMPLANT
SUT MON AB 3-0 SH 27 (SUTURE) ×4
SUT MON AB 3-0 SH27 (SUTURE) ×4 IMPLANT
SUT PROLENE 6 0 PC 1 (SUTURE) IMPLANT
SUT STEEL 0 (SUTURE)
SUT STEEL 0 18XMFL TIE 17 (SUTURE) IMPLANT
SUT STEEL 1 (SUTURE) IMPLANT
SUT STEEL 2 (SUTURE) IMPLANT
SUT STEEL 4 (SUTURE) IMPLANT
SUT VIC AB 4-0 PS2 18 (SUTURE) ×1 IMPLANT
SUT VICRYL 4-0 PS2 18IN ABS (SUTURE) IMPLANT
TOWEL GREEN STERILE (TOWEL DISPOSABLE) ×3 IMPLANT
TOWEL GREEN STERILE FF (TOWEL DISPOSABLE) ×3 IMPLANT
TRAY ENT MC OR (CUSTOM PROCEDURE TRAY) ×3 IMPLANT

## 2022-04-07 NOTE — Transfer of Care (Signed)
Immediate Anesthesia Transfer of Care Note ? ?Patient: Scott Mckee ? ?Procedure(s) Performed: OPEN REDUCTION INTERNAL FIXATION (ORIF) MANDIBULAR FRACTURE (Mouth) ? ?Patient Location: PACU ? ?Anesthesia Type:General ? ?Level of Consciousness: drowsy ? ?Airway & Oxygen Therapy: Patient Spontanous Breathing ? ?Post-op Assessment: Report given to RN and Post -op Vital signs reviewed and stable ? ?Post vital signs: Reviewed and stable ? ?Last Vitals:  ?Vitals Value Taken Time  ?BP 169/108 04/07/22 1746  ?Temp    ?Pulse 93 04/07/22 1748  ?Resp 11 04/07/22 1748  ?SpO2 96 % 04/07/22 1748  ?Vitals shown include unvalidated device data. ? ?Last Pain:  ?Vitals:  ? 04/07/22 1323  ?TempSrc:   ?PainSc: 3   ?   ? ?Patients Stated Pain Goal: 0 (04/07/22 1323) ? ?Complications: No notable events documented. ?

## 2022-04-07 NOTE — Interval H&P Note (Signed)
History and Physical Interval Note: ? ?04/07/2022 ?12:30 PM ? ?Scott Mckee  has presented today for surgery, with the diagnosis of Mandibular Fracture.  The various methods of treatment have been discussed with the patient and family. After consideration of risks, benefits and other options for treatment, the patient has consented to  Procedure(s): ?OPEN REDUCTION INTERNAL FIXATION (ORIF) MANDIBULAR FRACTURE (N/A) as a surgical intervention.  The patient's history has been reviewed, patient examined, no change in status, stable for surgery.  I have reviewed the patient's chart and labs.  Questions were answered to the patient's satisfaction.   ? ? ?Allena Napoleon ? ? ?

## 2022-04-07 NOTE — H&P (Signed)
Reason for Consult/CC: Mandible fracture  Scott Mckee is an 46 y.o. male.  HPI: Patient presents after a fall this past weekend.  He sustained trauma to his chin on some steps and suffered a parasymphyseal mandibular fracture along with bilateral subcondylar fractures.  He also had a laceration to his tongue.  He was seen in the emergency room and evaluated and discharged and follows up today for surgery.  He reports no changes and no other injuries.  He reports that his teeth do not fit together normally but he has no issues with vision or breathing through his nose.  Allergies: No Known Allergies  Medications:  Current Facility-Administered Medications:    [START ON 04/08/2022] ceFAZolin (ANCEF) IVPB 2g/100 mL premix, 2 g, Intravenous, On Call to OR, Cindra Presume, MD   chlorhexidine (PERIDEX) 0.12 % solution 15 mL, 15 mL, Mouth/Throat, Once **OR** MEDLINE mouth rinse, 15 mL, Mouth Rinse, Once, Merlinda Frederick, MD   lactated ringers infusion, , Intravenous, Continuous, Merlinda Frederick, MD  No past medical history on file.  No past surgical history on file.  No family history on file.  Social History:  reports that he has been smoking. He has never used smokeless tobacco. He reports current alcohol use. He reports that he does not use drugs.  Physical Exam Blood pressure (!) 143/102, pulse (!) 110, temperature 97.6 F (36.4 C), temperature source Axillary, resp. rate 19, height 5\' 5"  (1.651 m), weight 56.7 kg, SpO2 97 %. General: No acute distress HEENT: Normocephalic.  Extraocular movements intact.  Pupils equal round reactive to light.  No bony step-offs palpable in the upper two thirds of the face.  Nasal bones are stable.  Both nares are patent.  Cranial nerves seem grossly intact.  He reports normal sensation in the V1, V2, V3 distributions.  He does have notable malocclusion and shortening of the mandible posteriorly due to the compressed subcondylar fractures.  He does have a  tongue laceration anteriorly that is about 1 cm to 2 cm in length.  Results for orders placed or performed during the hospital encounter of 04/05/22 (from the past 48 hour(s))  CBC with Differential     Status: Abnormal   Collection Time: 04/06/22 12:17 AM  Result Value Ref Range   WBC 12.1 (H) 4.0 - 10.5 K/uL   RBC 4.05 (L) 4.22 - 5.81 MIL/uL   Hemoglobin 13.0 13.0 - 17.0 g/dL   HCT 38.4 (L) 39.0 - 52.0 %   MCV 94.8 80.0 - 100.0 fL   MCH 32.1 26.0 - 34.0 pg   MCHC 33.9 30.0 - 36.0 g/dL   RDW 15.6 (H) 11.5 - 15.5 %   Platelets 300 150 - 400 K/uL   nRBC 0.0 0.0 - 0.2 %   Neutrophils Relative % 84 %   Neutro Abs 10.2 (H) 1.7 - 7.7 K/uL   Lymphocytes Relative 9 %   Lymphs Abs 1.1 0.7 - 4.0 K/uL   Monocytes Relative 6 %   Monocytes Absolute 0.7 0.1 - 1.0 K/uL   Eosinophils Relative 0 %   Eosinophils Absolute 0.0 0.0 - 0.5 K/uL   Basophils Relative 0 %   Basophils Absolute 0.1 0.0 - 0.1 K/uL   Immature Granulocytes 1 %   Abs Immature Granulocytes 0.06 0.00 - 0.07 K/uL    Comment: Performed at [facility], [address], [place], [place] [id number]  Basic metabolic panel     Status: Abnormal   Collection Time: 04/06/22  12:17 AM  Result Value Ref Range   Sodium 136 135 - 145 mmol/L   Potassium 3.6 3.5 - 5.1 mmol/L   Chloride 104 98 - 111 mmol/L   CO2 16 (L) 22 - 32 mmol/L   Glucose, Bld 144 (H) 70 - 99 mg/dL    Comment: Glucose reference range applies only to samples taken after fasting for at least 8 hours.   BUN 5 (L) 6 - 20 mg/dL   Creatinine, Ser 2.95 (L) 0.61 - 1.24 mg/dL   Calcium 9.0 8.9 - 62.1 mg/dL   GFR, Estimated >30 >86 mL/min    Comment: (NOTE) Calculated using the CKD-EPI Creatinine Equation (2021)    Anion gap 16 (H) 5 - 15    Comment: Performed at [facility], [address], [place], [place] [address]  Ethanol     Status: Abnormal   Collection Time: 04/06/22 12:17 AM  Result Value Ref Range   Alcohol, Ethyl (B) 291 (H) <10 mg/dL    Comment:  (NOTE) Lowest detectable limit for serum alcohol is 10 mg/dL.  For medical purposes only. Performed at [facility], [address], [place], [place] [address]     DG Chest 2 View  Result Date: 04/06/2022 CLINICAL DATA:  Fall with possible tooth aspiration EXAM: CHEST - 2 VIEW COMPARISON:  None. FINDINGS: The heart size and mediastinal contours are within normal limits. Both lungs are clear. The visualized skeletal structures are unremarkable. IMPRESSION: No teeth visualized within the chest. Electronically Signed   By: Deatra Robinson M.D.   On: 04/06/2022 00:21   CT Head Wo Contrast  Result Date: 04/06/2022 CLINICAL DATA:  Syncope, fall EXAM: CT HEAD WITHOUT CONTRAST TECHNIQUE: Contiguous axial images were obtained from the base of the skull through the vertex without intravenous contrast. RADIATION DOSE REDUCTION: This exam was performed according to the departmental dose-optimization program which includes automated exposure control, adjustment of the mA and/or kV according to patient size and/or use of iterative reconstruction technique. COMPARISON:  None. FINDINGS: Brain: No acute intracranial abnormality. Specifically, no hemorrhage, hydrocephalus, mass lesion, acute infarction, or significant intracranial injury. Vascular: No hyperdense vessel or unexpected calcification. Skull: No acute calvarial abnormality. Sinuses/Orbits: No acute findings Other: None IMPRESSION: No acute intracranial abnormality. Electronically Signed   By: Charlett Nose M.D.   On: 04/06/2022 00:32   CT Cervical Spine Wo Contrast  Result Date: 04/06/2022 CLINICAL DATA:  Fall down steps. EXAM: CT CERVICAL SPINE WITHOUT CONTRAST TECHNIQUE: Multidetector CT imaging of the cervical spine was performed without intravenous contrast. Multiplanar CT image reconstructions were also generated. RADIATION DOSE REDUCTION: This exam was performed according to the departmental dose-optimization program which includes automated  exposure control, adjustment of the mA and/or kV according to patient size and/or use of iterative reconstruction technique. COMPARISON:  None. FINDINGS: Alignment: No visible subluxation. Skull base and vertebrae: Evaluation of the C1-2 region is limited due to motion despite repeating. Otherwise no visible fracture. Soft tissues and spinal canal: No prevertebral fluid or swelling. No visible canal hematoma. Disc levels:  Maintains Upper chest: No acute findings. Other: None IMPRESSION: Limited study due to patient motion. The upper cervical spine evaluation is nondiagnostic due to motion artifact. Otherwise no visible acute bony abnormality. Electronically Signed   By: Charlett Nose M.D.   On: 04/06/2022 00:41   CT Maxillofacial Wo Contrast  Result Date: 04/06/2022 CLINICAL DATA:  Fall, facial injury EXAM: CT MAXILLOFACIAL WITHOUT CONTRAST TECHNIQUE: Multidetector CT imaging of the  maxillofacial structures was performed. Multiplanar CT image reconstructions were also generated. RADIATION DOSE REDUCTION: This exam was performed according to the departmental dose-optimization program which includes automated exposure control, adjustment of the mA and/or kV according to patient size and/or use of iterative reconstruction technique. COMPARISON:  None. FINDINGS: Osseous: Fracture involving the mandible near the midline and extending into the right parasymphyseal region extending between the right canine and premolar. Fracture noted through both mandibular necks bilaterally with displacement of the mandibular heads and anterior TMJ dislocation bilaterally. Fracture also noted through the anterior cortex of the maxilla to the right of midline adjacent to the remaining right upper incisor and canine. Lucency noted around the remaining right upper incisor as well as the right upper premolar and molars compatible with periapical abscesses. Numerous dental caries. Zygomatic arches and sinus walls are intact. Orbits:  Choose 1 Sinuses: Clear Soft tissues: Soft tissue swelling in the upper lip and chin region. Limited intracranial: See head CT report IMPRESSION: Mandible fracture in the symphysis and right parasymphyseal region. Fractures through the anterior cortex of the maxilla to the right of midline anterior to the right upper incisor and right upper canine. Fracture through the mandibular necks bilaterally with displacement of the mandibular heads and anterior TMJ dislocation bilaterally. Numerous periapical abscesses involving the right upper teeth. Multiple dental caries. Electronically Signed   By: Rolm Baptise M.D.   On: 04/06/2022 00:40    Assessment/Plan: Patient presents with bilateral subcondylar mandibular fractures with anterior subluxation of the condyle bilaterally.  He also has a right parasymphyseal fracture and malocclusion.  I recommended surgical treatment of his mandibular fractures to help restore occlusion.  We also discussed debridement and repair of the tongue laceration.  We discussed risks include bleeding, infection, damage to surrounding structures and need for additional procedures.  We discussed the need to keep his jaw immobilized in MMF for at least a month.  We discussed the potential for challenges with the reduction that may warrant additional operations.  All of his questions were answered and he is interested in moving forward.  Cindra Presume 04/07/2022, 12:26 PM

## 2022-04-07 NOTE — Anesthesia Preprocedure Evaluation (Signed)
Anesthesia Evaluation  ?Patient identified by MRN, date of birth, ID band ?Patient awake ? ? ? ?Reviewed: ?Allergy & Precautions, NPO status , Patient's Chart, lab work & pertinent test results ? ?Airway ?Mallampati: Unable to assess ? ? ? ? ? ?Comment: Patient unable to fully open due to pain; does not feel jaw is stuck Dental ?no notable dental hx. ? ?  ?Pulmonary ?neg pulmonary ROS, Current Smoker and Patient abstained from smoking.,  ?  ?breath sounds clear to auscultation ? ? ? ? ? ? Cardiovascular ?negative cardio ROS ? ? ?Rhythm:Regular  ? ?  ?Neuro/Psych ?negative neurological ROS ? negative psych ROS  ? GI/Hepatic ?negative GI ROS, (+)  ?  ? substance abuse ? marijuana use,   ?Endo/Other  ?negative endocrine ROS ? Renal/GU ?negative Renal ROS  ?negative genitourinary ?  ?Musculoskeletal ?negative musculoskeletal ROS ?(+)  ? Abdominal ?  ?Peds ?negative pediatric ROS ?(+)  Hematology ?negative hematology ROS ?(+)   ?Anesthesia Other Findings ? ? Reproductive/Obstetrics ?negative OB ROS ? ?  ? ? ? ? ? ? ? ? ? ? ? ? ? ?  ?  ? ? ? ? ? ? ? ? ?Anesthesia Physical ?Anesthesia Plan ? ?ASA: 2 and emergent ? ?Anesthesia Plan: General  ? ?Post-op Pain Management: Minimal or no pain anticipated and Tylenol PO (pre-op)*  ? ?Induction: Intravenous ? ?PONV Risk Score and Plan: 1 and Treatment may vary due to age or medical condition ? ?Airway Management Planned: Nasal ETT and Video Laryngoscope Planned ? ?Additional Equipment:  ? ?Intra-op Plan:  ? ?Post-operative Plan: Extubation in OR ? ?Informed Consent: I have reviewed the patients History and Physical, chart, labs and discussed the procedure including the risks, benefits and alternatives for the proposed anesthesia with the patient or authorized representative who has indicated his/her understanding and acceptance.  ? ? ? ?Dental advisory given ? ?Plan Discussed with: CRNA, Anesthesiologist and Surgeon ? ?Anesthesia Plan Comments:    ? ? ? ? ? ? ?Anesthesia Quick Evaluation ? ?

## 2022-04-07 NOTE — Anesthesia Postprocedure Evaluation (Signed)
Anesthesia Post Note ? ?Patient: Scott Mckee ? ?Procedure(s) Performed: OPEN REDUCTION INTERNAL FIXATION (ORIF) MANDIBULAR FRACTURE (Mouth) ? ?  ? ?Patient location during evaluation: PACU ?Anesthesia Type: General ?Level of consciousness: sedated and patient cooperative ?Pain management: pain level controlled ?Vital Signs Assessment: post-procedure vital signs reviewed and stable ?Respiratory status: spontaneous breathing ?Cardiovascular status: stable ?Anesthetic complications: no ? ? ?No notable events documented. ? ?Last Vitals:  ?Vitals:  ? 04/07/22 1815 04/07/22 1830  ?BP: (!) 176/118 (!) 171/91  ?Pulse: 89 89  ?Resp: 14 12  ?Temp:  (!) 36.3 ?C  ?SpO2: 90% 93%  ?  ?Last Pain:  ?Vitals:  ? 04/07/22 1815  ?TempSrc:   ?PainSc: 6   ? ? ?  ?  ?  ?  ?  ?  ? ?[name] ? ? ? ? ?

## 2022-04-07 NOTE — Anesthesia Procedure Notes (Signed)
Procedure Name: Intubation ?Date/Time: 04/07/2022 4:25 PM ?Performed by: Minerva Ends, CRNA ?Pre-anesthesia Checklist: Patient identified, Emergency Drugs available, Suction available and Patient being monitored ?Patient Re-evaluated:Patient Re-evaluated prior to induction ?Oxygen Delivery Method: Circle system utilized ?Preoxygenation: Pre-oxygenation with 100% oxygen ?Induction Type: IV induction ?Ventilation: Mask ventilation without difficulty ?Laryngoscope Size: Mac, 3 and Glidescope ?Grade View: Grade I ?Nasal Tubes: Right, Nasal prep performed and Nasal Scott Mckee ?Tube size: 7.0 mm ?Number of attempts: 1 ?Placement Confirmation: ETT inserted through vocal cords under direct vision, positive ETCO2 and breath sounds checked- equal and bilateral ?Tube secured with: Tape ?Dental Injury: Teeth and Oropharynx as per pre-operative assessment  ? ? ? ? ?

## 2022-04-07 NOTE — Discharge Instructions (Addendum)
Activity: As tolerated, but avoid strenuous activity until follow up visit. ? ?Diet: Liquid diet only until you are seen and cleared by Dr. Barbette Merino or another oral surgeon. You have multiple loose teeth.   ? ?Wound Care: Recommend that swish twice daily with the prescribed Peridex solution. Again, liquid diet only. ? ?Medications: Recommend Tylenol and ibuprofen.  You were prescribed Oxycodone yesterday which you can take as needed for breakthrough pain.  You have also been prescribed Zofran which you can take for nausea.  You were prescribed Augmentin to help mitigate your risk of infection.  Please take all of these medications, as directed. ? ?Call our office if any unusual problems occur such as pain, excessive ?bleeding, unrelieved nausea/vomiting, fever &/or chills. ? ?Follow-up appointment: Scheduled for next week. ? ?

## 2022-04-07 NOTE — Op Note (Signed)
Operative Note  ? ?DATE OF OPERATION: 04/07/2022 ? ?SURGICAL DEPARTMENT: Plastic Surgery ? ?PREOPERATIVE DIAGNOSES: Mandibular fractures ? ?POSTOPERATIVE DIAGNOSES:  same ? ?PROCEDURE: 1.  Open reduction internal fixation of right mandibular parasymphyseal fracture ?2.  Closed reduction of bilateral mandibular subcondylar fractures with TMJ dislocation ? ?SURGEON: Talmadge Coventry, MD ? ?ASSISTANT: Krista Blue, PA ?The advanced practice practitioner (APP) assisted throughout the case.  The APP was essential in retraction and counter traction when needed to make the case progress smoothly.  This retraction and assistance made it possible to see the tissue planes for the procedure.  The assistance was needed for hemostasis, tissue re-approximation and closure of the incision site.  ? ?ANESTHESIA:  General.  ? ?COMPLICATIONS: None.  ? ?INDICATIONS FOR PROCEDURE:  ?The patient, Markus Arminio is a 46 y.o. male born on 28-Jul-1976, is here for treatment of mandibular fractures ?MRN: [id number] ? ?CONSENT:  ?Informed consent was obtained directly from the patient. Risks, benefits and alternatives were fully discussed. Specific risks including but not limited to bleeding, infection, hematoma, seroma, scarring, pain, contracture, asymmetry, wound healing problems, and need for further surgery were all discussed. The patient did have an ample opportunity to have questions answered to satisfaction.  ? ?DESCRIPTION OF PROCEDURE:  ?The patient was taken to the operating room. SCDs were placed and antibiotics were given.  General anesthesia was administered.  The patient's operative site was prepped and draped in a sterile fashion. A time out was performed and all information was confirmed to be correct.  Started by evaluating the patient's dentition.  This was quite poor.  There was numerous loose teeth.  There is very few molars.  He did not appear to be a candidate for maxillomandibular fixation.  Given the subcondylar  fractures with TMJ dislocation I did attempt a reduction to lengthen the posterior aspect of the mandible.  It did feel like I felt a give which hopefully indicated the mandibular condyles returning to the appropriate position.  It is difficult to determine the success of the reduction given the lack of dentition.  He did have a right parasymphyseal fracture that was slightly displaced.  I elected to approach this and stabilize it to give him some additional mandibular stability while he heals.  I infiltrated local anesthetic and gave it time to wait.  I made an incision in the gingivobuccal sulcus leaving a cuff to send her to at the end.  This was done with cautery.  I did take this down to the bone centrally and used the hemostat to spread to identify the mental nerve.  Once this was identified I took the dissection down to the bone and did a subperiosteal dissection to identify the fracture.  This was identified and slightly displaced.  Distraction of the mandibular symphysis did reduce this nicely.  I then used a [employer]ryker 1.5 mm 4-hole fracture plate with three 2.0 screws 14 mm in length.  I did use one 2.3 diameter screw with 16 mm in length for better purchase.  I then applied a tension band which was 1.0 mm 4-hole plate fixated with 2.0 x 5 mm bone screws unicortical.  This had a nice reduction and the screws felt that they had good purchase.  I then irrigated the wound.  Closure was done with 3-0 Vicryl sutures.  The plan will be to consult with an oral surgeon for what will likely be multiple teeth removals and to discuss neck steps regarding the TMJ. ? ?The patient tolerated  the procedure well.  There were no complications. The patient was allowed to wake from anesthesia, extubated and taken to the recovery room in satisfactory condition.  ? ?

## 2022-04-08 ENCOUNTER — Other Ambulatory Visit: Payer: Self-pay | Admitting: Plastic Surgery

## 2022-04-08 NOTE — Progress Notes (Signed)
Discussed patient with on-call oral surgeon Dr. Ross Marcus.  Explained to the morphology of the subcondylar fractures and the poor dentition.  He agreed with stabilization of the parasymphyseal fracture which was carried out yesterday.  He did not recommend any further exploration of the condyle.  He did not recommend acute teeth extraction.  He recommended patient follow-up with local oral surgeon or dentist for evaluation for tooth extraction in 6 to 8 weeks.  He did agree with liquid diet for 6 weeks.  Appreciate his assistance. ?

## 2022-04-10 ENCOUNTER — Encounter (HOSPITAL_COMMUNITY): Payer: Self-pay | Admitting: Plastic Surgery

## 2022-04-14 ENCOUNTER — Telehealth: Payer: Self-pay | Admitting: Plastic Surgery

## 2022-04-14 NOTE — Telephone Encounter (Signed)
Erroneous encounter. Please disregard.

## 2022-04-16 ENCOUNTER — Institutional Professional Consult (permissible substitution): Payer: Self-pay | Admitting: Plastic Surgery

## 2022-04-18 ENCOUNTER — Ambulatory Visit: Payer: Self-pay | Admitting: Physician Assistant

## 2022-04-24 ENCOUNTER — Encounter: Payer: Self-pay | Admitting: Plastic Surgery

## 2022-04-24 ENCOUNTER — Ambulatory Visit (INDEPENDENT_AMBULATORY_CARE_PROVIDER_SITE_OTHER): Payer: Self-pay | Admitting: Plastic Surgery

## 2022-04-24 VITALS — BP 119/81 | HR 94 | Ht 64.0 in | Wt 121.6 lb

## 2022-04-24 DIAGNOSIS — S02609D Fracture of mandible, unspecified, subsequent encounter for fracture with routine healing: Secondary | ICD-10-CM

## 2022-04-24 NOTE — Progress Notes (Signed)
Patient presents status post open reduction internal fixation of a parasymphyseal fracture.  He also had bilateral subcondylar fractures where I attempted a reduction but was unable to apply MMF due to the lack of dentition.  I have spoken with Dr. Ross Marcus who was the oral surgeon on-call about how to manage the condyles and he recommended not to diet for 6 weeks.  Patient feels like he is doing well.  On exam intraoral incision is healing appropriately with no signs of any hardware exposure.  Continues to have poor dentition.  I explained that he should continue with the nonsugar diet for another few weeks.  I will see him at that point and see how he is doing.  He has noticed improvement in range of motion of his jaw opening and closing but significant issues with movement from side to side.  His tongue laceration is healing well also.  All of his questions were answered. ?

## 2022-05-15 ENCOUNTER — Encounter: Payer: Self-pay | Admitting: Surgical

## 2022-05-15 NOTE — Progress Notes (Deleted)
46 year old male here for follow-up after open reduction internal fixation of parasymphyseal fracture.  He also had bilateral subcondylar fractures, however unable to MMF due to lack of dentition.  Currently recommended 6 weeks of soft diet.

## 2022-07-06 IMAGING — CT CT ABD-PELV W/ CM
2 of 5 series · 16 of 46 positions shown, 18 images · IV contrast (Omni 300)
Comparison: None.

CLINICAL DATA: Pancreatitis suspected. Chest and abdominal pain for
several days.

EXAM:
CT ABDOMEN AND PELVIS WITH CONTRAST
TECHNIQUE: Multidetector CT imaging of the abdomen and pelvis was performed
using the standard protocol following bolus administration of
intravenous contrast.
CONTRAST:  100mL OMNIPAQUE IOHEXOL 300 MG/ML  SOLN

[Series 3: a/p w/ 5mm · axial · 0.63mm/px · z∈[-984,-599]mm · 13 of 87 slices shown, 15 images]
[im 5/87  soft-tissue]
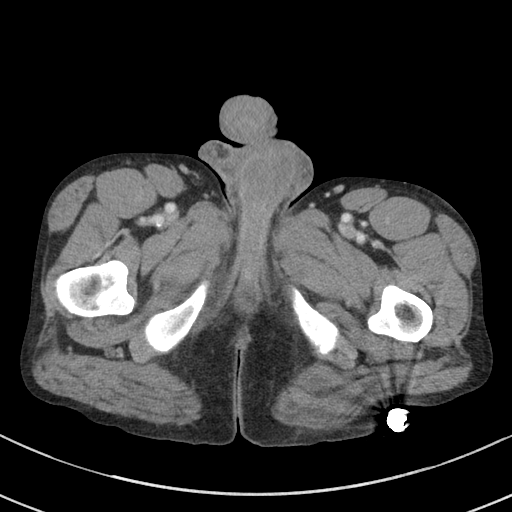
[im 5/87  bone]
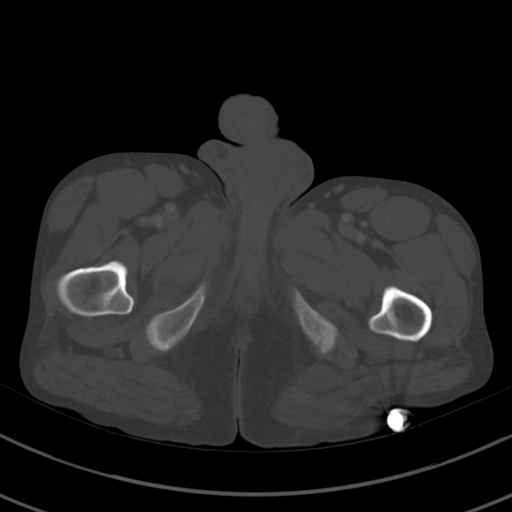
[im 14/87  soft-tissue]
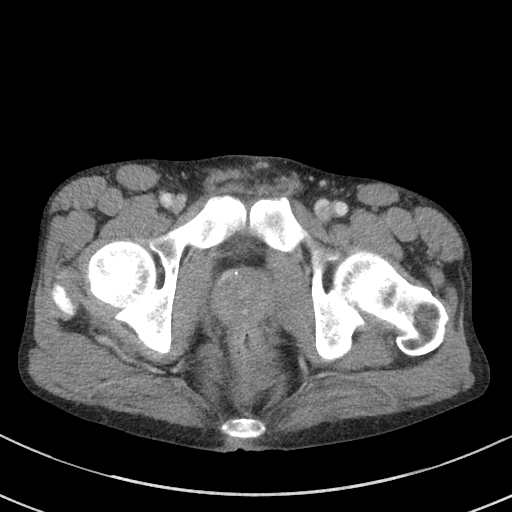
[im 19/87  soft-tissue]
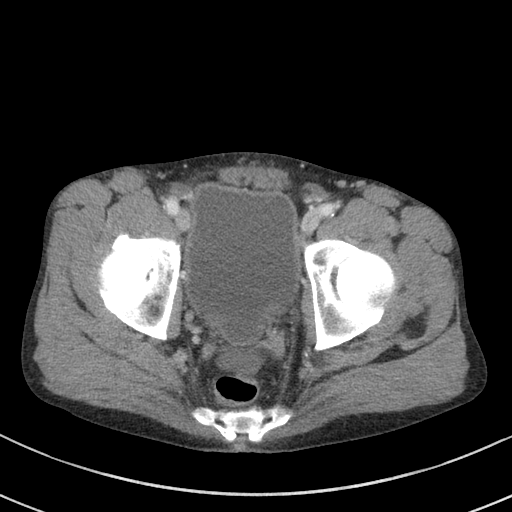
[im 23/87  soft-tissue]
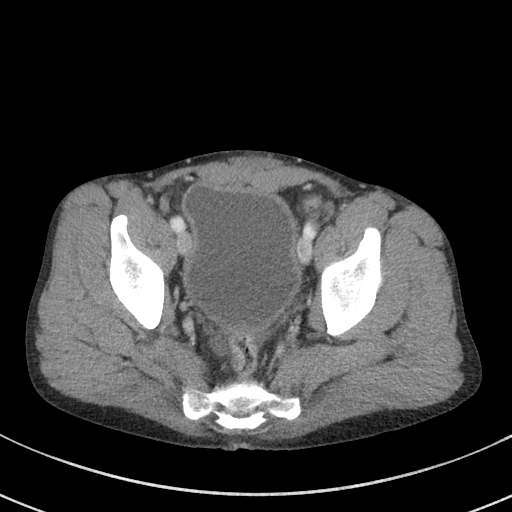
[im 32/87  soft-tissue]
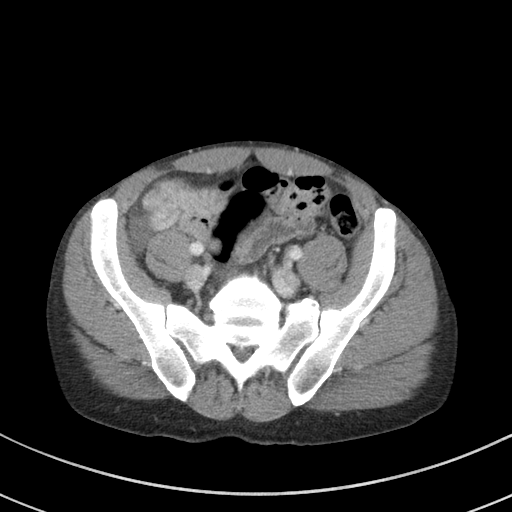
[im 37/87  soft-tissue]
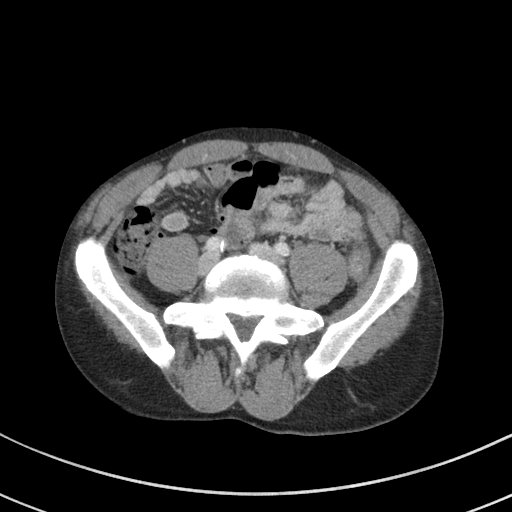
[im 46/87  soft-tissue]
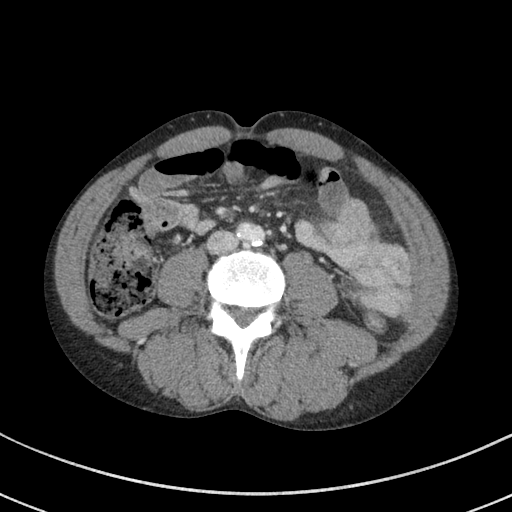
[im 50/87  soft-tissue]
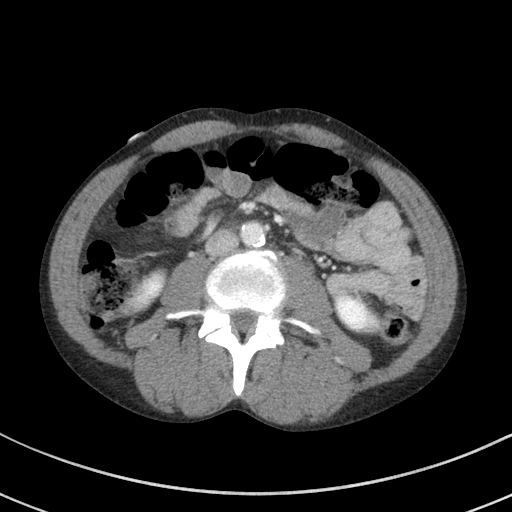
[im 55/87  soft-tissue]
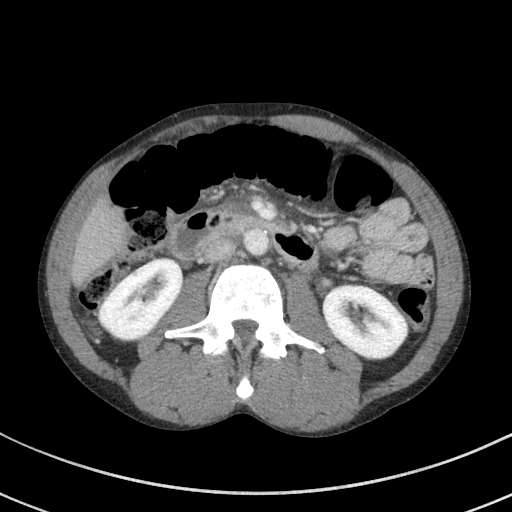
[im 55/87  bone]
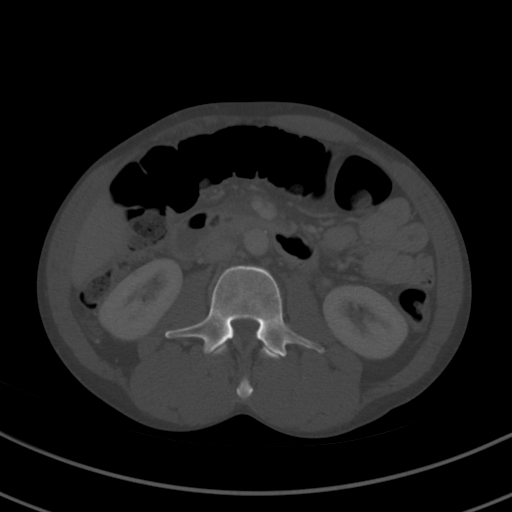
[im 64/87  soft-tissue]
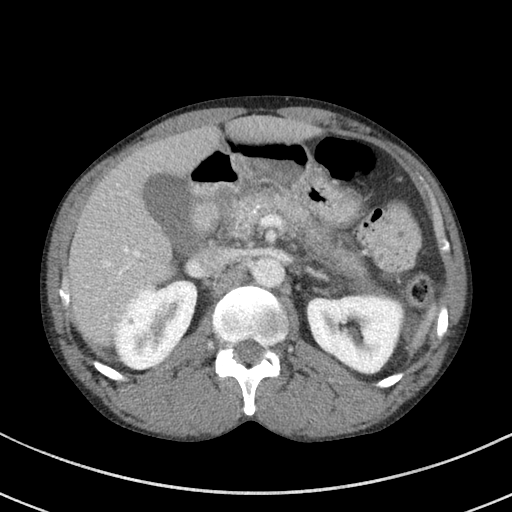
[im 68/87  soft-tissue]
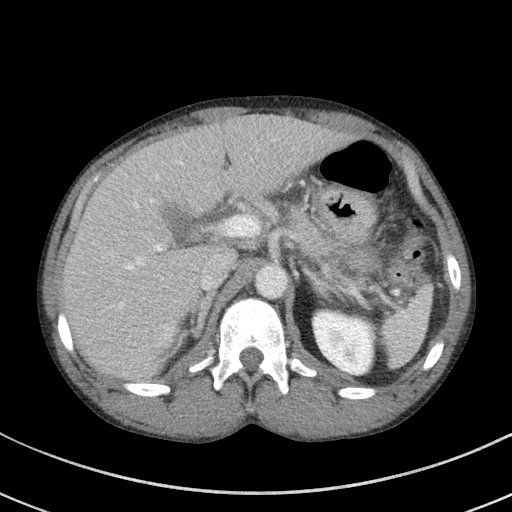
[im 73/87  soft-tissue]
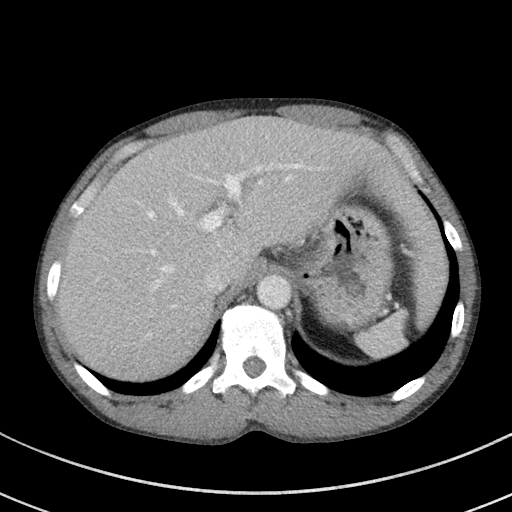
[im 82/87  soft-tissue]
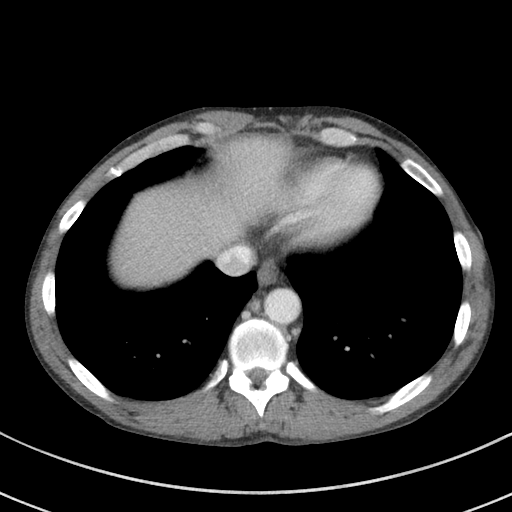

[Series 6: a/p w/ cor · coronal · 0.66mm/px · 3 of 114 slices shown]
[im 38/114  soft-tissue]
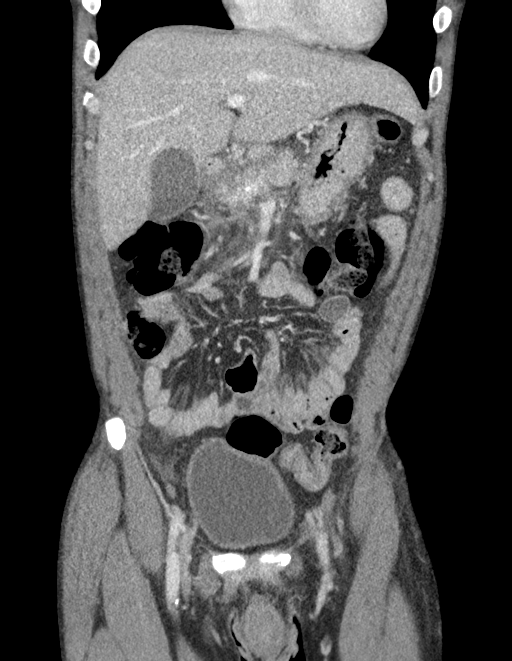
[im 51/114  soft-tissue]
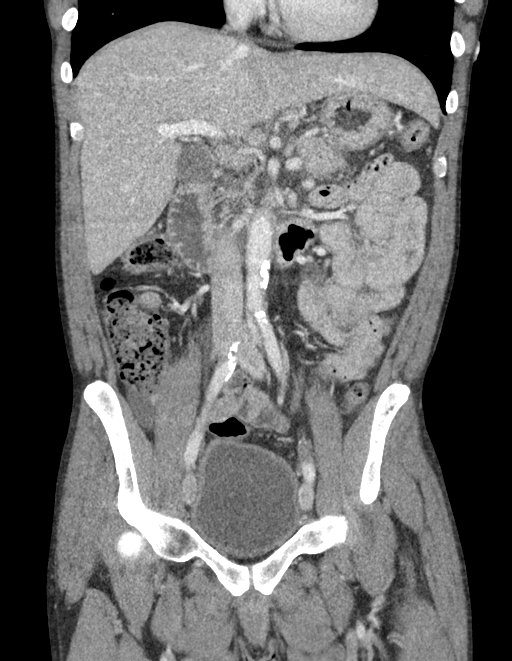
[im 63/114  soft-tissue]
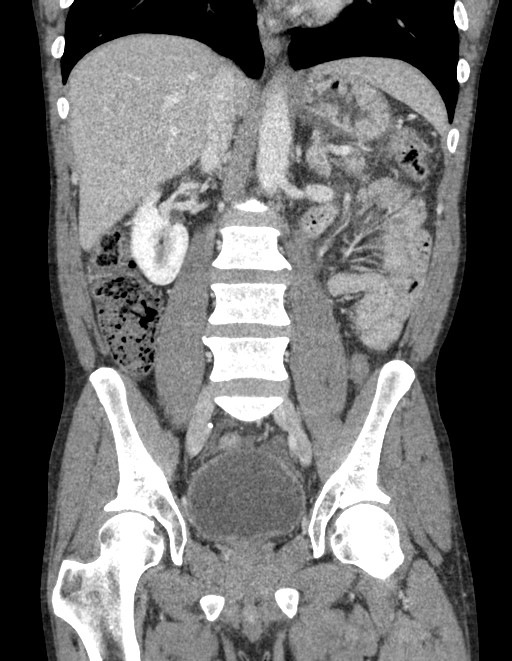

[16 of 46 positions shown; findings below may reference images not displayed]

FINDINGS: Lower chest: No acute abnormality.

Hepatobiliary: No focal liver abnormality is seen. No gallstones,
gallbladder wall thickening, or biliary dilatation.

Pancreas: Signs of acute on chronic pancreatitis identified. There
is diffuse edema with mild peripancreatic inflammatory fat stranding
involving the pancreas. Calcifications within the head of pancreas
are noted reflecting changes due to chronic pancreatitis. Small
cystic lesion within the anterior head of pancreas measures 5 mm and
favored to represent small the chronic pseudocyst, image, image
[DATE]. No pancreatic mass or signs of main duct dilatation.

Spleen: Normal in size without focal abnormality.

Adrenals/Urinary Tract: Normal adrenal glands. No signs of kidney
mass or hydronephrosis identified. The urinary bladder is
unremarkable.

Stomach/Bowel: Stomach appears normal. The appendix is visualized
and is unremarkable. No bowel wall thickening, inflammation or
distension.

Vascular/Lymphatic: Aortic atherosclerosis. No aneurysm. No
abdominal or pelvic adenopathy.

Reproductive: Prostate is unremarkable.

Other: Small volume of free fluid tracks along the right pericolic
gutter into the posterior pelvis. No suspicious fluid collections
identified.

Musculoskeletal: No acute or significant osseous findings.
IMPRESSION: 1. Signs of acute on chronic pancreatitis. No complicating features
identified.
2. Small volume of free fluid tracks along the right pericolic
gutter into the posterior pelvis. No focal fluid collections
identified.
3. Aortic atherosclerosis.
4. Small cystic lesion within the uncinate process measuring 6 mm is
favored to represent small the chronic pseudocyst.

Aortic Atherosclerosis ([id number]).

## 2022-07-07 IMAGING — US US ABDOMEN LIMITED
1 series · 14 of 25 positions shown · non-contrast
Comparison: CT 07/13/2021

CLINICAL DATA: Pancreatitis

EXAM:
ULTRASOUND ABDOMEN LIMITED RIGHT UPPER QUADRANT

[Series 1: us abdomen limited ruq (liver/gb) · 14 of 84 slices shown]
[im 1/84]
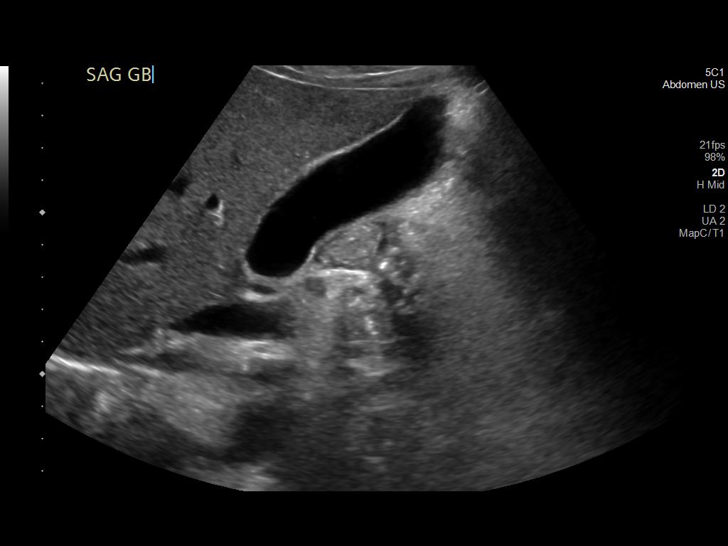
[im 7/84]
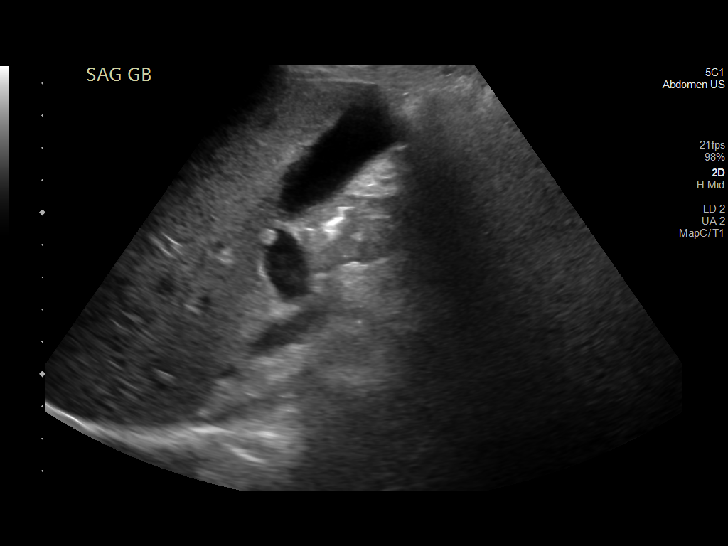
[im 14/84]
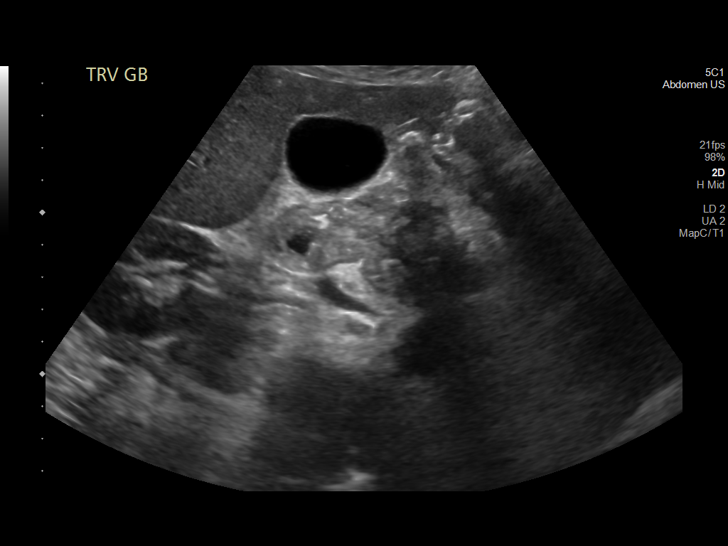
[im 21/84]
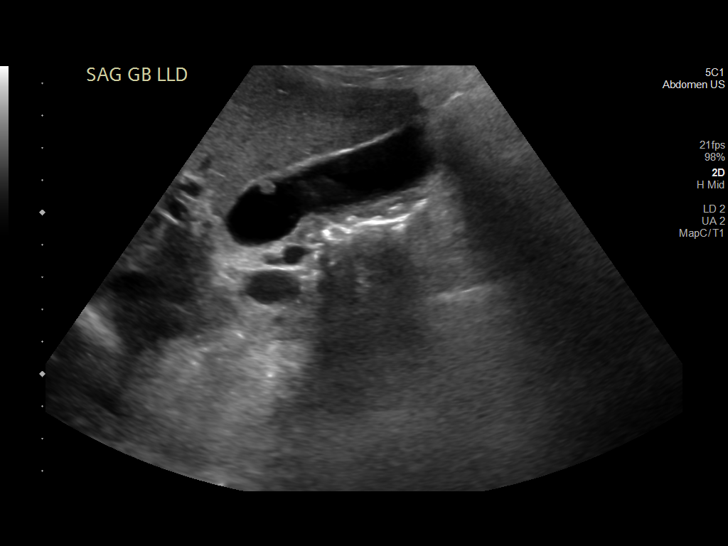
[im 28/84]
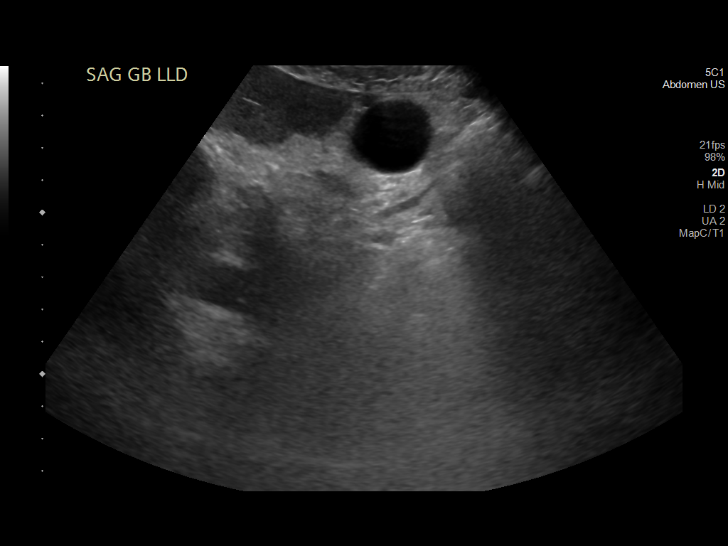
[im 32/84]
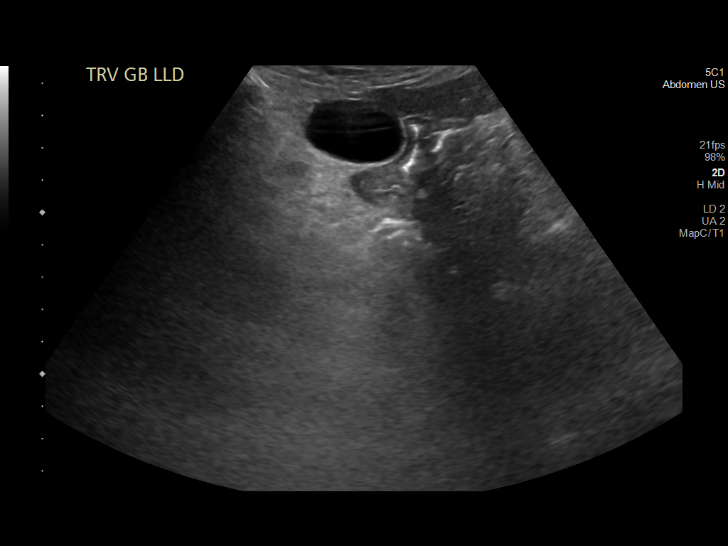
[im 39/84]
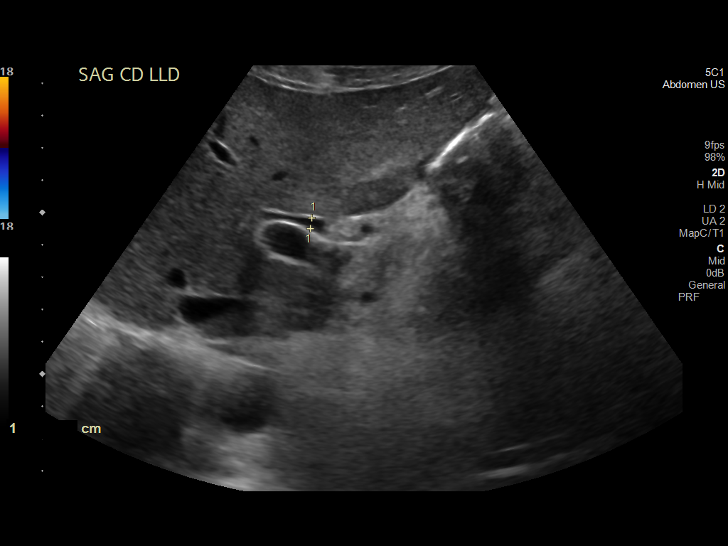
[im 45/84]
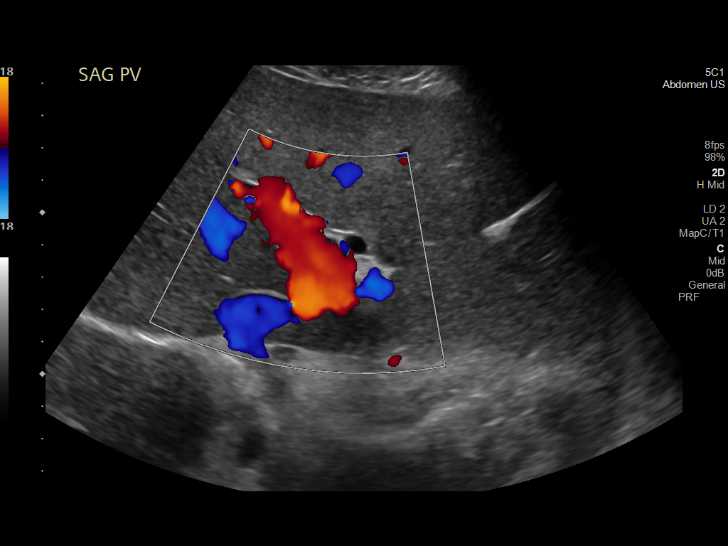
[im 52/84]
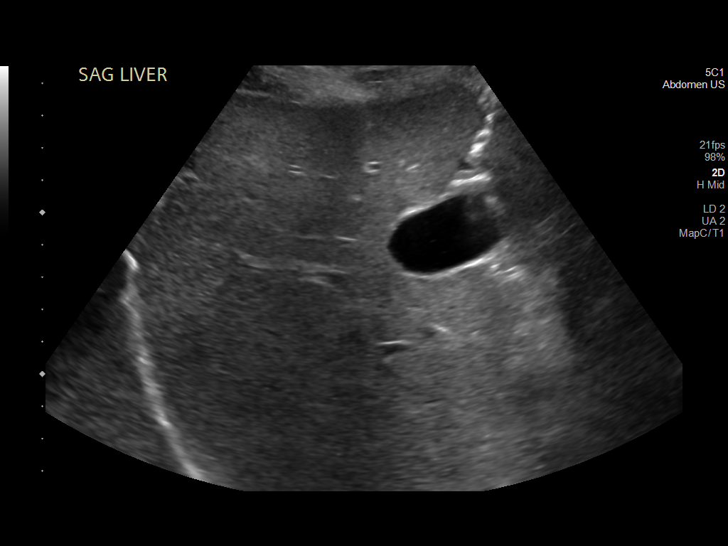
[im 56/84]
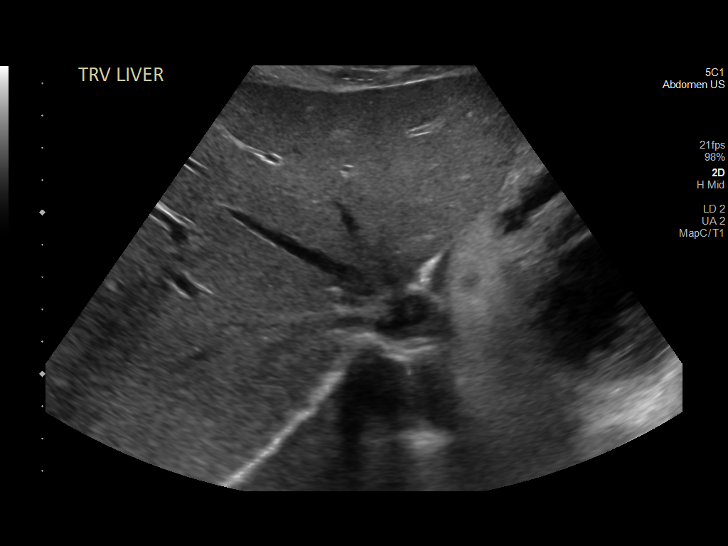
[im 63/84]
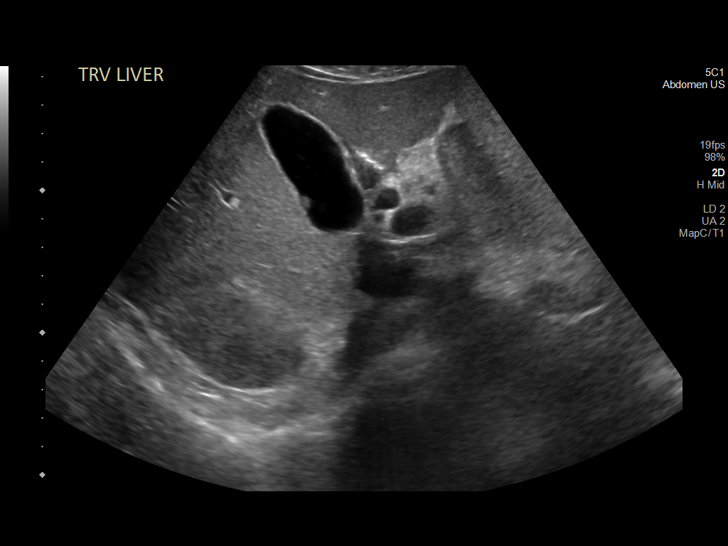
[im 70/84]
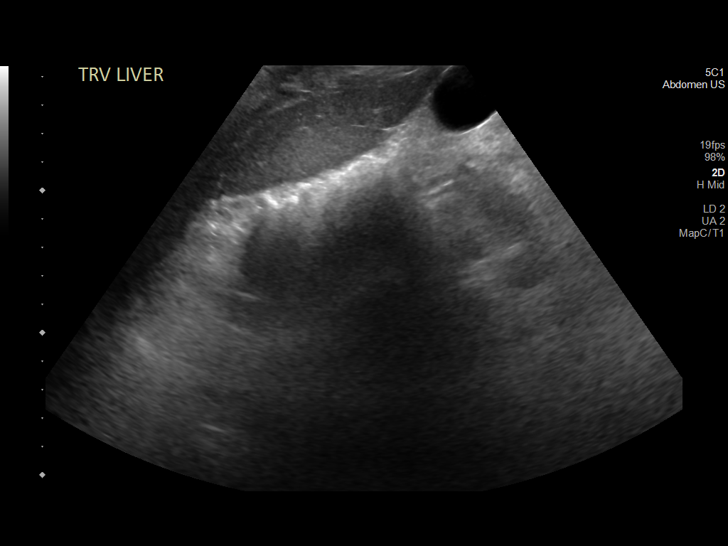
[im 77/84]
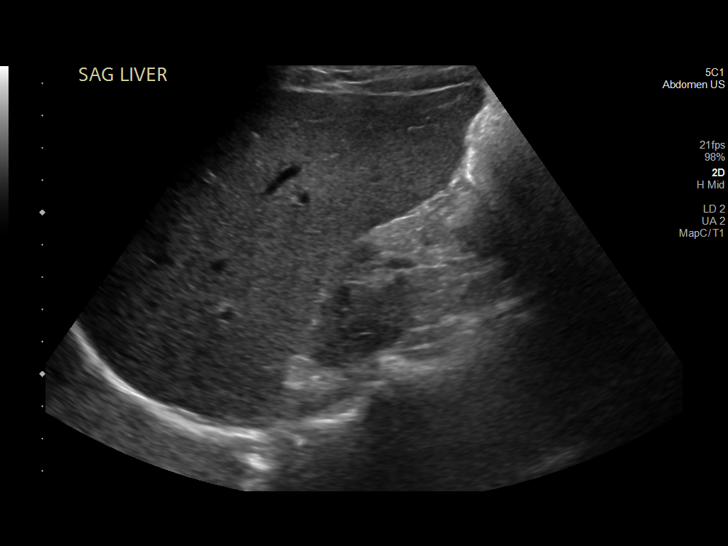
[im 84/84]
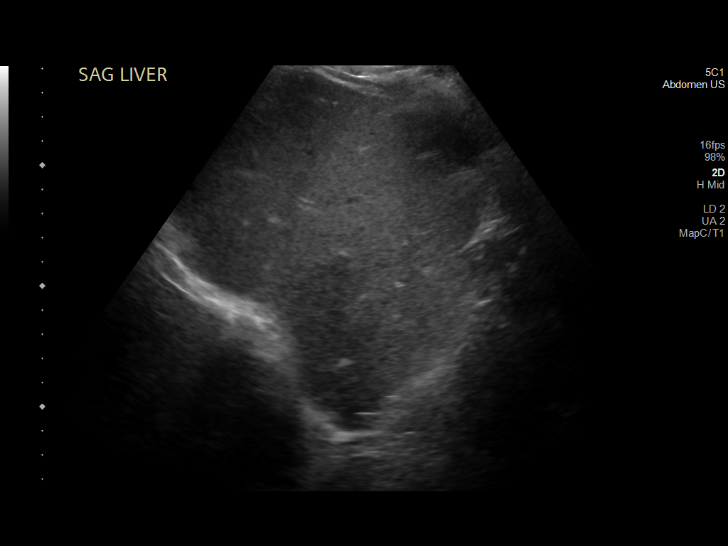

[14 of 25 positions shown; findings below may reference images not displayed]

FINDINGS: Gallbladder:

2 mm gallstone. A 4 mm anterior gallbladder wall polyp. No
gallbladder wall thickening. No pericholecystic fluid. Sonographic
Murphy's sign was not elicited.

Common bile duct:

Diameter: Normal, 3 mm.

Liver:

No focal lesion identified. Within normal limits in parenchymal
echogenicity. Portal vein is patent on color Doppler imaging with
normal direction of blood flow towards the liver.

Other: None.
IMPRESSION: Cholelithiasis without acute cholecystitis or biliary duct
dilatation.

4 mm gallbladder polyp which per consensus criteria can be presumed
benign and does not warrant imaging surveillance. This
recommendation follows ACR consensus guidelines: White Paper of the
ACR Incidental Findings Committee II on Gallbladder and Biliary
Findings. [HOSPITAL] 3221:;[DATE].

## 2024-09-20 ENCOUNTER — Encounter (HOSPITAL_COMMUNITY): Payer: Self-pay

## 2024-09-20 ENCOUNTER — Ambulatory Visit (HOSPITAL_COMMUNITY)
Admission: EM | Admit: 2024-09-20 | Discharge: 2024-09-20 | Disposition: A | Discharge status: Home / Self Care | Attending: Emergency Medicine | Admitting: Emergency Medicine

## 2024-09-20 DIAGNOSIS — R197 Diarrhea, unspecified: Secondary | ICD-10-CM | POA: Diagnosis not present

## 2024-09-20 DIAGNOSIS — R112 Nausea with vomiting, unspecified: Secondary | ICD-10-CM | POA: Diagnosis not present

## 2024-09-20 DIAGNOSIS — R1013 Epigastric pain: Secondary | ICD-10-CM | POA: Diagnosis not present

## 2024-09-20 MED ORDER — FAMOTIDINE 40 MG PO TABS: 40.0000 mg | ORAL_TABLET | Freq: Every day | ORAL | 0 refills | Status: AC

## 2024-09-20 MED ORDER — ONDANSETRON 4 MG PO TBDP: 4.0000 mg | ORAL_TABLET | Freq: Three times a day (TID) | ORAL | 0 refills | Status: AC | PRN

## 2024-09-20 MED ORDER — OMEPRAZOLE 20 MG PO CPDR: 20.0000 mg | DELAYED_RELEASE_CAPSULE | Freq: Every day | ORAL | 0 refills | Status: AC

## 2024-09-20 NOTE — ED Triage Notes (Signed)
 Pt c/o upper epigastric pain with vomiting and diarrhea since Sunday. Taking tylenol  PM's with some relief. States pain worse after eating. States vomiting x6 times since Sunday.

## 2024-09-20 NOTE — Discharge Instructions (Signed)
 You can take Zofran  every 8 hours as needed for nausea and vomiting. Start taking omeprazole once daily in the morning and famotidine  once daily at bedtime to help with epigastric pain and indigestion. Make sure you are staying hydrated and getting plenty of rest. You can follow-up with [facility] gastroenterology for further evaluation if your symptoms continue. Otherwise if you develop worsening abdominal pain, excessive vomiting, blood in your vomit or stool, weakness, or high fevers please seek immediate medical treatment in the emergency department.

## 2024-09-20 NOTE — ED Provider Notes (Signed)
 [facility]    CSN: [id number] Arrival date & time: 09/20/24  1355      History   Chief Complaint Chief Complaint  Patient presents with   Abdominal Pain    HPI Scott Mckee is a 48 y.o. male.   Patient presents with epigastric pain, nausea, vomiting, and diarrhea that began on 10/5.  Patient states he has been taking Tylenol  with some relief of his pain.  Patient states the pain and symptoms are worse after eating.  Patient states that he has been 6 x since 10/5. Patient states that he has had approximately 3 episodes of diarrhea in the last 24 hours.  Patient denies any blood in his vomit or stool.  Patient also denies fever, body aches, chills, and weakness.  Patient does have a history of pancreatitis, but states that his pain is in a different location than when he has flares of this. Patient states that he has continued to drink alcohol daily, and states that it does not seem like this worsens his pain.  Patient rates his pain as 6 out of 10 at this time.  Patient states that his pain was a 10 out of 10 yesterday and he had difficulty walking due to this pain and therefore was unable to go to work.  Patient is requesting a work note today.  The history is provided by the patient and medical records.  Abdominal Pain   History reviewed. No pertinent past medical history.  Patient Active Problem List   Diagnosis Date Noted   Acute pancreatitis 07/13/2021    Past Surgical History:  Procedure Laterality Date   ORIF MANDIBULAR FRACTURE N/A 04/07/2022   Procedure: OPEN REDUCTION INTERNAL FIXATION (ORIF) MANDIBULAR FRACTURE;  Surgeon: Elisabeth Craig RAMAN, MD;  Location: [facility] OR;  Service: Plastics;  Laterality: N/A;       Home Medications    Prior to Admission medications   Medication Sig Start Date End Date Taking? Authorizing Provider  famotidine  (PEPCID ) 40 MG tablet Take 1 tablet (40 mg total) by mouth daily. 09/20/24  Yes Johnie, Goldie Dimmer A, NP  omeprazole  (PRILOSEC) 20 MG capsule Take 1 capsule (20 mg total) by mouth daily. 09/20/24  Yes Johnie, Arthelia Callicott A, NP  ondansetron  (ZOFRAN -ODT) 4 MG disintegrating tablet Take 1 tablet (4 mg total) by mouth every 8 (eight) hours as needed for nausea or vomiting. 09/20/24  Yes Johnie Rumaldo DELENA, NP    Family History History reviewed. No pertinent family history.  Social History Social History   Tobacco Use   Smoking status: Every Day    Current packs/day: 0.25    Types: Cigarettes   Smokeless tobacco: Never  Vaping Use   Vaping status: Never Used  Substance Use Topics   Alcohol use: Yes    Alcohol/week: 5.0 standard drinks of alcohol    Types: 5 Cans of beer per week   Drug use: Yes    Types: Marijuana    Comment: 4 days ago     Allergies   Patient has no known allergies.   Review of Systems Review of Systems  Gastrointestinal:  Positive for abdominal pain.   Per HPI  Physical Exam Triage Vital Signs ED Triage Vitals  Encounter Vitals Group     BP 09/20/24 1541 127/85     Girls Systolic BP Percentile --      Girls Diastolic BP Percentile --      Boys Systolic BP Percentile --      Boys Diastolic  BP Percentile --      Pulse Rate 09/20/24 1541 63     Resp 09/20/24 1541 18     Temp 09/20/24 1541 98 F (36.7 C)     Temp Source 09/20/24 1541 Oral     SpO2 09/20/24 1541 96 %     Weight --      Height --      Head Circumference --      Peak Flow --      Pain Score 09/20/24 1539 5     Pain Loc --      Pain Education --      Exclude from Growth Chart --    No data found.  Updated Vital Signs BP 127/85 (BP Location: Left Arm)   Pulse 63   Temp 98 F (36.7 C) (Oral)   Resp 18   SpO2 96%   Visual Acuity Right Eye Distance:   Left Eye Distance:   Bilateral Distance:    Right Eye Near:   Left Eye Near:    Bilateral Near:     Physical Exam Vitals and nursing note reviewed.  Constitutional:      General: He is awake. He is not in acute distress.    Appearance:  Normal appearance. He is well-developed and well-groomed. He is not ill-appearing.  Cardiovascular:     Rate and Rhythm: Normal rate and regular rhythm.  Pulmonary:     Effort: Pulmonary effort is normal.     Breath sounds: Normal breath sounds.  Chest:     Chest wall: No tenderness.  Abdominal:     General: Abdomen is flat. Bowel sounds are normal.     Palpations: Abdomen is soft.     Tenderness: There is abdominal tenderness in the epigastric area.  Skin:    General: Skin is warm and dry.  Neurological:     Mental Status: He is alert.  Psychiatric:        Behavior: Behavior is cooperative.      UC Treatments / Results  Labs (all labs ordered are listed, but only abnormal results are displayed) Labs Reviewed - No data to display  EKG   Radiology No results found.  Procedures Procedures (including critical care time)  Medications Ordered in UC Medications - No data to display  Initial Impression / Assessment and Plan / UC Course  I have reviewed the triage vital signs and the nursing notes.  Pertinent labs & imaging results that were available during my care of the patient were reviewed by me and considered in my medical decision making (see chart for details).     Patient is overall well-appearing.  Vitals are stable.  Tenderness noted to epigastric region.  No other significant findings on exam.  Pain unlikely related to pancreatitis due to location and other contributing symptoms.  Prescribe Zofran  as needed for nausea and vomiting.  Prescribed omeprazole and famotidine  to help with epigastric pain and indigestion.  Given GI follow-up if needed.  Discussed importance of hydration.  Discussed follow-up, return, and strict ER precautions.  Provided patient with work note as requested. Final Clinical Impressions(s) / UC Diagnoses   Final diagnoses:  Epigastric pain  Nausea vomiting and diarrhea     Discharge Instructions      You can take Zofran  every 8 hours  as needed for nausea and vomiting. Start taking omeprazole once daily in the morning and famotidine  once daily at bedtime to help with epigastric pain and indigestion. Make sure you  are staying hydrated and getting plenty of rest. You can follow-up with [facility] gastroenterology for further evaluation if your symptoms continue. Otherwise if you develop worsening abdominal pain, excessive vomiting, blood in your vomit or stool, weakness, or high fevers please seek immediate medical treatment in the emergency department.   ED Prescriptions     Medication Sig Dispense Auth. Provider   ondansetron  (ZOFRAN -ODT) 4 MG disintegrating tablet Take 1 tablet (4 mg total) by mouth every 8 (eight) hours as needed for nausea or vomiting. 10 tablet Johnie Flaming A, NP   omeprazole (PRILOSEC) 20 MG capsule Take 1 capsule (20 mg total) by mouth daily. 30 capsule Johnie Flaming A, NP   famotidine  (PEPCID ) 40 MG tablet Take 1 tablet (40 mg total) by mouth daily. 30 tablet Johnie Flaming A, NP      PDMP not reviewed this encounter.   Johnie Flaming A, NP 09/20/24 1714
